# Patient Record
Sex: Male | Born: 1970 | Race: White | Hispanic: No | Marital: Single | Smoking: Current every day smoker
Health system: Southern US, Community
[De-identification: ages and names within clinical notes are randomized; demographics above are authoritative.]

## PROBLEM LIST (undated history)

## (undated) DIAGNOSIS — I10 Essential (primary) hypertension: Secondary | ICD-10-CM

## (undated) DIAGNOSIS — F419 Anxiety disorder, unspecified: Secondary | ICD-10-CM

## (undated) DIAGNOSIS — E119 Type 2 diabetes mellitus without complications: Secondary | ICD-10-CM

## (undated) DIAGNOSIS — J189 Pneumonia, unspecified organism: Secondary | ICD-10-CM

## (undated) DIAGNOSIS — F32A Depression, unspecified: Secondary | ICD-10-CM

## (undated) HISTORY — DX: Anxiety disorder, unspecified: F41.9

## (undated) HISTORY — PX: NO PAST SURGERIES: SHX2092

## (undated) HISTORY — DX: Depression, unspecified: F32.A

## (undated) HISTORY — DX: Pneumonia, unspecified organism: J18.9

## (undated) HISTORY — DX: Essential (primary) hypertension: I10

---

## 2019-10-15 ENCOUNTER — Ambulatory Visit: Payer: Self-pay | Admitting: Orthopaedic Surgery

## 2019-11-05 ENCOUNTER — Telehealth: Payer: Self-pay

## 2019-11-05 ENCOUNTER — Ambulatory Visit (INDEPENDENT_AMBULATORY_CARE_PROVIDER_SITE_OTHER): Payer: Worker's Compensation | Admitting: Orthopaedic Surgery

## 2019-11-05 ENCOUNTER — Encounter: Payer: Self-pay | Admitting: Orthopaedic Surgery

## 2019-11-05 ENCOUNTER — Ambulatory Visit: Payer: Self-pay

## 2019-11-05 VITALS — BP 147/92 | HR 83 | Ht 71.0 in | Wt 298.0 lb

## 2019-11-05 DIAGNOSIS — M5412 Radiculopathy, cervical region: Secondary | ICD-10-CM

## 2019-11-05 DIAGNOSIS — M542 Cervicalgia: Secondary | ICD-10-CM | POA: Diagnosis not present
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
## 2019-11-05 NOTE — Telephone Encounter (Signed)
E-mailed todays office note to the case mgr per her request
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
## 2019-11-08 ENCOUNTER — Encounter: Payer: Self-pay | Admitting: Orthopaedic Surgery

## 2019-11-08 ENCOUNTER — Ambulatory Visit (INDEPENDENT_AMBULATORY_CARE_PROVIDER_SITE_OTHER): Payer: Worker's Compensation | Admitting: Orthopaedic Surgery

## 2019-11-08 DIAGNOSIS — M4722 Other spondylosis with radiculopathy, cervical region: Secondary | ICD-10-CM | POA: Diagnosis not present

## 2019-11-08 MED ORDER — PREDNISONE 5 MG (21) PO TBPK: ORAL_TABLET | ORAL | 0 refills | Status: DC
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
## 2019-11-11 DIAGNOSIS — M4722 Other spondylosis with radiculopathy, cervical region: Secondary | ICD-10-CM | POA: Insufficient documentation

## 2019-11-14 ENCOUNTER — Telehealth: Payer: Self-pay | Admitting: Orthopaedic Surgery

## 2019-11-14 NOTE — Telephone Encounter (Signed)
Ok for this? Please advise.

## 2019-11-14 NOTE — Telephone Encounter (Signed)
Kelly with [employer] [employer] called stating the pt has been showing more symptoms and his leg gave out resulting in a serious fall; Tresa Endo believes it would be best for the pt to be written out of work until he can be seen on 11/27/19. Tresa Endo would like to have the work note faxed to her   Tresa Endo CB# [phone] Fax# [phone]

## 2019-11-15 ENCOUNTER — Telehealth: Payer: Self-pay | Admitting: Surgery

## 2019-11-15 NOTE — Telephone Encounter (Signed)
Faxed

## 2019-11-15 NOTE — Telephone Encounter (Signed)
Holding for JN/Christy

## 2019-11-15 NOTE — Telephone Encounter (Signed)
Patient called back with the fax number to send note to his job. [phone]
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
## 2019-11-18 NOTE — Telephone Encounter (Signed)
See other note. Duplicate request 

## 2019-11-18 NOTE — Telephone Encounter (Signed)
This looks like a yates patient.

## 2019-11-18 NOTE — Telephone Encounter (Signed)
Please advise. OK for note?  

## 2019-11-18 NOTE — Telephone Encounter (Signed)
Note generated and faxed.  °

## 2019-11-18 NOTE — Telephone Encounter (Signed)
OK for note. thanks
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
## 2019-11-27 ENCOUNTER — Other Ambulatory Visit: Payer: Self-pay

## 2019-11-27 ENCOUNTER — Telehealth: Payer: Self-pay

## 2019-11-27 ENCOUNTER — Ambulatory Visit (INDEPENDENT_AMBULATORY_CARE_PROVIDER_SITE_OTHER): Payer: Worker's Compensation | Admitting: Surgery

## 2019-11-27 ENCOUNTER — Encounter: Payer: Self-pay | Admitting: Surgery

## 2019-11-27 VITALS — BP 145/90 | HR 83 | Ht 71.0 in | Wt 298.0 lb

## 2019-11-27 DIAGNOSIS — M5412 Radiculopathy, cervical region: Secondary | ICD-10-CM | POA: Diagnosis not present

## 2019-11-27 DIAGNOSIS — R2681 Unsteadiness on feet: Secondary | ICD-10-CM

## 2019-11-27 DIAGNOSIS — R29898 Other symptoms and signs involving the musculoskeletal system: Secondary | ICD-10-CM | POA: Diagnosis not present

## 2019-11-27 DIAGNOSIS — M4802 Spinal stenosis, cervical region: Secondary | ICD-10-CM | POA: Diagnosis not present

## 2019-11-27 NOTE — Telephone Encounter (Signed)
Are you working on this for patient?

## 2019-11-27 NOTE — Telephone Encounter (Signed)
Jessica from [facility] called in wanting to know if patient should go to er for stat cervical mri . Says not sure if they can sch it for him.

## 2019-11-27 NOTE — Progress Notes (Signed)
Office Visit Note   Patient: Philip Stokes           Date of Birth: [birth year]           MRN: [id number] Visit Date: 11/27/2019              Requested by: No referring provider defined for this encounter. PCP: Patient, No Pcp Per   Assessment & Plan: Visit Diagnoses:  1. Radiculopathy, cervical region   2. Spinal stenosis of cervical region   3. Weakness of both lower extremities   4. Upper extremity weakness   5. Unsteady gait     Plan: With patient's progressively worsening weakness by history and exam along with unsteady gait with 2 recent falls I recommend getting a stat repeat cervical MRI to compare to the study that was done November 07, 2019.  Patient's history and exam is completely different from when I initially saw him.  Worker's Comp. RN case manager here today and will help to get this scan done today.  I did review this case with Dr. Ophelia Charter this afternoon and he agrees with getting this stat study as soon as possible.  Patient was recently seen by gastroenterologist for melena so he cannot take oral NSAIDs.  I was asked if he could increase his dose of gabapentin but I did not recommend this due to the fact that this could also worsen gait abnormalities and risk of falls.  Patient or RN case manager will need to bring [place] a copy of the disc today if scan is able to get approved through [employer].  Follow-Up Instructions: Return in about 1 week (around 12/04/2019) for With Dr. Ophelia Charter.   Orders:  No orders of the defined types were placed in this encounter.  No orders of the defined types were placed in this encounter.     Procedures: No procedures performed   Clinical Data: No additional findings.   Subjective: Chief Complaint  Patient presents with  . Neck - Pain    HPI 49 year old white male comes in today for recheck of his neck issues.  Patient's current neck issues result of a workers comp injury that is well-documented in his chart.  Patient had last seen  Dr. Ophelia Charter for review of the cervical MRI and that is also well documented.  Patient states that Dr. Ophelia Charter advised him that ultimately may come down to him needing surgical intervention for his neck.  Patient states that he was also advised to watch out for any symptoms of weakness, unsteady gait etc. due to the severity of his findings.  On November 13, 2019 patient had returned back to work and he was getting up onto his truck when his left leg gave out and he fell from a height of about 4 to 5 feet landing directly onto his back and neck.  Patient states that he also suffered another fall in his home last week where he got up from his chair and fell forward.  Since his last visit with me he has had worsening issues with unsteady gait and feeling "clumsy".  Did not describe the symptoms when I had previously seen him.  Denies any bowel or bladder incontinence.  Was recently seen in the emergency room for melena.  He is not able to take any oral NSAIDs.  Review of Systems No current cardiac pulmonary issues.  Patient recently seen in the emergency room for melena  Objective: Vital Signs: BP (!) 145/90   Pulse 83  Ht 5\' 11"  (1.803 m)   Wt 298 lb (135.2 kg)   BMI 41.56 kg/m   Physical Exam Pleasant white male alert and oriented in no acute distress.  Patient's gait is unsteady when I have him walk in the room.  He did appear to lose his balance a couple times when I had him turn around.  He does have trace bilateral biceps triceps quad hip flexion anterior tib and gastroc weakness on exam. Ortho Exam  Specialty Comments:  No specialty comments available.  Imaging: No results found.   PMFS History: Patient Active Problem List   Diagnosis Date Noted  . Other spondylosis with radiculopathy, cervical region 11/11/2019   History reviewed. No pertinent past medical history.  History reviewed. No pertinent family history.  History reviewed. No pertinent surgical history. Social History    Occupational History  . Not on file  Tobacco Use  . Smoking status: Current Every Day Smoker  . Smokeless tobacco: Never Used  Substance and Sexual Activity  . Alcohol use: Not Currently  . Drug use: Not on file  . Sexual activity: Not on file

## 2019-11-27 NOTE — Addendum Note (Signed)
Addended by: Rogers Seeds on: 11/27/2019 02:50 PM   Modules accepted: Orders

## 2019-11-28 ENCOUNTER — Ambulatory Visit
Admission: RE | Admit: 2019-11-28 | Discharge: 2019-11-28 | Disposition: A | Discharge status: Home / Self Care | Payer: Worker's Compensation | Source: Ambulatory Visit | Attending: Orthopaedic Surgery | Admitting: Orthopaedic Surgery

## 2019-11-28 DIAGNOSIS — R29898 Other symptoms and signs involving the musculoskeletal system: Secondary | ICD-10-CM

## 2019-11-28 DIAGNOSIS — M4802 Spinal stenosis, cervical region: Secondary | ICD-10-CM

## 2019-11-28 DIAGNOSIS — M5412 Radiculopathy, cervical region: Secondary | ICD-10-CM

## 2019-11-28 DIAGNOSIS — R2681 Unsteadiness on feet: Secondary | ICD-10-CM

## 2019-12-03 ENCOUNTER — Ambulatory Visit (INDEPENDENT_AMBULATORY_CARE_PROVIDER_SITE_OTHER): Payer: Worker's Compensation | Admitting: Orthopaedic Surgery

## 2019-12-03 DIAGNOSIS — M4722 Other spondylosis with radiculopathy, cervical region: Secondary | ICD-10-CM

## 2019-12-03 DIAGNOSIS — M4802 Spinal stenosis, cervical region: Secondary | ICD-10-CM | POA: Diagnosis not present

## 2019-12-03 MED ORDER — OXYCODONE-ACETAMINOPHEN 5-325 MG PO TABS: 1.0000 | ORAL_TABLET | Freq: Four times a day (QID) | ORAL | 0 refills | Status: DC | PRN
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
## 2019-12-04 ENCOUNTER — Other Ambulatory Visit: Payer: Self-pay

## 2019-12-04 NOTE — Progress Notes (Signed)
Your procedure is scheduled on Monday, September 13th.  Report to [facility] Main Entrance "A" at 10:30 A.M., and check in at the Admitting office.  Call this number if you have problems the morning of surgery:  [phone]  Call [phone] if you have any questions prior to your surgery date Monday-Friday 8am-4pm   Remember:  Do not eat after midnight the night before your surgery  You may drink clear liquids until 9:30 A.M. the morning of your surgery.   Clear liquids allowed are: Water, Non-Citrus Juices (without pulp), Carbonated Beverages, Clear Tea, Black Coffee Only, and Gatorade  Enhanced Recovery after Surgery for Orthopedics Enhanced Recovery after Surgery is a protocol used to improve the stress on your body and your recovery after surgery.  Patient Instructions  . The night before surgery:  o No food after midnight. ONLY clear liquids after midnight  .  Marland Kitchen The day of surgery (if you do NOT have diabetes):  o Drink ONE (1) Pre-Surgery Clear Ensure by 9:30 A.M. the morning of surgery o This drink was given to you during your hospital  pre-op appointment visit. o Nothing else to drink after completing the  Pre-Surgery Clear Ensure.         If you have questions, please contact your surgeon's office.   Take these medicines the morning of surgery with A SIP OF WATER  busPIRone (BUSPAR)  carvedilol (COREG) gabapentin (NEURONTIN) pantoprazole (PROTONIX)  predniSONE (STERAPRED UNI-PAK 21 TAB)  IF NEEDED: oxyCODONE-acetaminophen (PERCOCET/ROXICET)   As of today, STOP taking any Aspirin (unless otherwise instructed by your surgeon) Aleve, Naproxen, Ibuprofen, Motrin, Advil, Goody's, BC's, all herbal medications, fish oil, and all vitamins.                  Do not wear jewelry.            Do not wear lotions, powders, colognes, or deodorant.            Men may shave face and neck.            Do not bring valuables to the hospital.            [facility] is not  responsible for any belongings or valuables.  Do NOT Smoke (Tobacco/Vaping) or drink Alcohol 24 hours prior to your procedure If you use a CPAP at night, you may bring all equipment for your overnight stay.   Contacts, glasses, dentures or bridgework may not be worn into surgery.      For patients admitted to the hospital, discharge time will be determined by your treatment team.   Patients discharged the day of surgery will not be allowed to drive home, and someone needs to stay with them for 24 hours.  Special instructions:   [place]- Preparing For Surgery  Before surgery, you can play an important role. Because skin is not sterile, your skin needs to be as free of germs as possible. You can reduce the number of germs on your skin by washing with CHG (chlorahexidine gluconate) Soap before surgery.  CHG is an antiseptic cleaner which kills germs and bonds with the skin to continue killing germs even after washing.    Oral Hygiene is also important to reduce your risk of infection.  Remember - BRUSH YOUR TEETH THE MORNING OF SURGERY WITH YOUR REGULAR TOOTHPASTE  Please do not use if you have an allergy to CHG or antibacterial soaps. If your skin becomes reddened/irritated stop using the CHG.  Do not shave (including legs and underarms) for at least 48 hours prior to first CHG shower. It is OK to shave your face.  Please follow these instructions carefully.   1. Shower the NIGHT BEFORE SURGERY and the MORNING OF SURGERY with CHG Soap.   2. If you chose to wash your hair, wash your hair first as usual with your normal shampoo.  3. After you shampoo, rinse your hair and body thoroughly to remove the shampoo.  4. Use CHG as you would any other liquid soap. You can apply CHG directly to the skin and wash gently with a scrungie or a clean washcloth.   5. Apply the CHG Soap to your body ONLY FROM THE NECK DOWN.  Do not use on open wounds or open sores. Avoid contact with your eyes, ears,  mouth and genitals (private parts). Wash Face and genitals (private parts)  with your normal soap.   6. Wash thoroughly, paying special attention to the area where your surgery will be performed.  7. Thoroughly rinse your body with warm water from the neck down.  8. DO NOT shower/wash with your normal soap after using and rinsing off the CHG Soap.  9. Pat yourself dry with a CLEAN TOWEL.  10. Wear CLEAN PAJAMAS to bed the night before surgery  11. Place CLEAN SHEETS on your bed the night of your first shower and DO NOT SLEEP WITH PETS.  Day of Surgery: Wear Clean/Comfortable clothing the morning of surgery Do not apply any deodorants/lotions.   Remember to brush your teeth WITH YOUR REGULAR TOOTHPASTE.   Please read over the following fact sheets that you were given.

## 2019-12-05 ENCOUNTER — Inpatient Hospital Stay (HOSPITAL_COMMUNITY)
Admission: RE | Admit: 2019-12-05 | Discharge: 2019-12-05 | Disposition: A | Discharge status: Home / Self Care | Payer: Self-pay | Source: Ambulatory Visit

## 2019-12-06 ENCOUNTER — Other Ambulatory Visit: Payer: Self-pay

## 2019-12-06 ENCOUNTER — Encounter (HOSPITAL_COMMUNITY): Payer: Self-pay

## 2019-12-06 ENCOUNTER — Encounter (HOSPITAL_COMMUNITY)
Admission: RE | Admit: 2019-12-06 | Discharge: 2019-12-06 | Disposition: A | Discharge status: Home / Self Care | Payer: Worker's Compensation | Source: Ambulatory Visit | Attending: Surgery | Admitting: Surgery

## 2019-12-06 ENCOUNTER — Telehealth: Payer: Self-pay | Admitting: Orthopaedic Surgery

## 2019-12-06 ENCOUNTER — Other Ambulatory Visit: Payer: Self-pay | Admitting: Surgical

## 2019-12-06 ENCOUNTER — Encounter (HOSPITAL_COMMUNITY)
Admission: RE | Admit: 2019-12-06 | Discharge: 2019-12-06 | Disposition: A | Discharge status: Home / Self Care | Payer: Worker's Compensation | Source: Ambulatory Visit | Attending: Orthopaedic Surgery | Admitting: Orthopaedic Surgery

## 2019-12-06 DIAGNOSIS — Z01812 Encounter for preprocedural laboratory examination: Secondary | ICD-10-CM | POA: Insufficient documentation

## 2019-12-06 DIAGNOSIS — M50121 Cervical disc disorder at C4-C5 level with radiculopathy: Secondary | ICD-10-CM | POA: Diagnosis not present

## 2019-12-06 DIAGNOSIS — E119 Type 2 diabetes mellitus without complications: Secondary | ICD-10-CM | POA: Diagnosis not present

## 2019-12-06 DIAGNOSIS — I10 Essential (primary) hypertension: Secondary | ICD-10-CM | POA: Diagnosis not present

## 2019-12-06 DIAGNOSIS — M4802 Spinal stenosis, cervical region: Secondary | ICD-10-CM | POA: Diagnosis not present

## 2019-12-06 DIAGNOSIS — Z01818 Encounter for other preprocedural examination: Secondary | ICD-10-CM

## 2019-12-06 LAB — URINALYSIS, ROUTINE W REFLEX MICROSCOPIC
Bilirubin Urine: NEGATIVE
Glucose, UA: 50 mg/dL — AB
Ketones, ur: NEGATIVE mg/dL
Leukocytes,Ua: NEGATIVE
Nitrite: NEGATIVE
Protein, ur: NEGATIVE mg/dL
Specific Gravity, Urine: 1.01 (ref 1.005–1.030)
pH: 5 (ref 5.0–8.0)

## 2019-12-06 LAB — COMPREHENSIVE METABOLIC PANEL
ALT: 44 U/L (ref 0–44)
AST: 28 U/L (ref 15–41)
Albumin: 4.4 g/dL (ref 3.5–5.0)
Alkaline Phosphatase: 123 U/L (ref 38–126)
Anion gap: 11 (ref 5–15)
BUN: 10 mg/dL (ref 6–20)
CO2: 22 mmol/L (ref 22–32)
Calcium: 9.8 mg/dL (ref 8.9–10.3)
Chloride: 100 mmol/L (ref 98–111)
Creatinine, Ser: 0.91 mg/dL (ref 0.61–1.24)
GFR calc Af Amer: >60 mL/min (ref >60)
GFR calc non Af Amer: >60 mL/min (ref >60)
Glucose, Bld: 202 mg/dL — ABNORMAL HIGH (ref 70–99)
Potassium: 3.9 mmol/L (ref 3.5–5.1)
Sodium: 133 mmol/L — ABNORMAL LOW (ref 135–145)
Total Bilirubin: 0.3 mg/dL (ref 0.3–1.2)

## 2019-12-06 LAB — CBC
HCT: 50.2 % (ref 39.0–52.0)
Hemoglobin: 16.9 g/dL (ref 13.0–17.0)
MCH: 31 pg (ref 26.0–34.0)
MCHC: 33.7 g/dL (ref 30.0–36.0)
MCV: 92.1 fL (ref 80.0–100.0)
Platelets: 254 10*3/uL (ref 150–400)
RBC: 5.45 MIL/uL (ref 4.22–5.81)
RDW: 13.6 % (ref 11.5–15.5)
WBC: 10.7 10*3/uL — ABNORMAL HIGH (ref 4.0–10.5)
nRBC: 0 % (ref 0.0–0.2)

## 2019-12-06 LAB — TYPE AND SCREEN
ABO/RH(D): A POS
Antibody Screen: NEGATIVE

## 2019-12-06 LAB — SURGICAL PCR SCREEN
MRSA, PCR: NEGATIVE
Staphylococcus aureus: NEGATIVE

## 2019-12-06 LAB — HEMOGLOBIN A1C
Hgb A1c MFr Bld: 9.8 % — ABNORMAL HIGH (ref 4.8–5.6)
Mean Plasma Glucose: 234.56 mg/dL

## 2019-12-06 MED ORDER — DEXTROSE 5 % IV SOLN
3.0000 g | INTRAVENOUS | Status: AC
  Administered 2019-12-09: 3 g via INTRAVENOUS
  Filled 2019-12-06: qty 3

## 2019-12-06 MED ORDER — ALPRAZOLAM 1 MG PO TABS: 1.0000 mg | ORAL_TABLET | Freq: Every evening | ORAL | 0 refills | Status: AC | PRN

## 2019-12-06 NOTE — Telephone Encounter (Signed)
Can you advise since Ophelia Charter is out? His surgery is Monday

## 2019-12-06 NOTE — Telephone Encounter (Signed)
Submitted RX.  Please let him know dont take with Percocet

## 2019-12-06 NOTE — Progress Notes (Signed)
Anesthesia Chart Review:  New diagnosis of uncontrolled type 2 diabetes at preadmission testing appointment.  CBG was 202, A1c was ordered which resulted at 9.8.  Patient carries no diagnosis of diabetes and is on no medication for this.  Dr. Ophelia Charter was contacted with these results.  He advised that due to patient's severe cervical stenosis and risk for permanent loss of function, the surgery needs to proceed as planned.  He understands the patient has new diagnosis of uncontrolled diabetes.  I will consult diabetes coordinator to see patient during admission.  Blood pressure initially significantly elevated on arrival, patient admitted to severe anxiety regarding upcoming surgery.  First blood pressure 179/98 however improved to 135/97 on recheck.  He is treated for hypertension on carvedilol and lisinopril-HCTZ.  Remainder of preop labs were unremarkable.  EKG 08/13/2019 (see copy in chart): NSR.  Rate 68.  Rightward axis.  Low voltage QRS.  Cannot rule out anterior infarct (cited on or before 05/02/2017).  When compared to ECG of 05/02/2017, no significant changes found.  TTE 04/09/2012 (Care Everywhere): Interpretation Summary  The study was technically difficult.There is normal left ventricular wall  thickness.  No regional wall motion abnormalities noted.  The left ventricle is hyperdynamic.  The study was technically difficult.  Left Ventricle  The left ventricle is borderline dilated. There is no thrombus. There is  normal left ventricular wall thickness. Left ventricular systolic function  is normal. Ejection Fraction = >55%. The left ventricle is hyperdynamic. No  regional wall motion abnormalities noted.  Right Ventricle  Borderline right ventricular enlargement.  Atria  Borderline left atrial enlargement. Right atrial size is normal. The  interatrial septum is intact with no evidence for an atrial septal defect.  Mitral Valve  The mitral valve leaflets appear thickened, but open  well. There is trace  mitral regurgitation.  Tricuspid Valve  The tricuspid valve is normal in structure and function.  Aortic Valve  The aortic valve is normal in structure and function.  Pulmonic Valve  The pulmonic valve is not well visualized.  Great Vessels  The aortic root is normal size.  Pericardium/Pleural  There is no pericardial effusion.   Zannie Cove [facility] Short Stay Center/Anesthesiology Phone [phone] 12/06/2019 3:52 PM

## 2019-12-06 NOTE — Progress Notes (Signed)
   12/06/19 0831  OBSTRUCTIVE SLEEP APNEA  Have you ever been diagnosed with sleep apnea through a sleep study? No  Do you snore loudly (loud enough to be heard through closed doors)?  1  Do you often feel tired, fatigued, or sleepy during the daytime (such as falling asleep during driving or talking to someone)? 0  Has anyone observed you stop breathing during your sleep? 1  Do you have, or are you being treated for high blood pressure? 1  BMI more than 35 kg/m2? 1  Age > 50 (1-yes) 0  Neck circumference greater than:Male 16 inches or larger, Male 17inches or larger? 1  Male Gender (Yes=1) 1  Obstructive Sleep Apnea Score 6

## 2019-12-06 NOTE — Anesthesia Preprocedure Evaluation (Addendum)
Anesthesia Evaluation  Patient identified by MRN, date of birth, ID band Patient awake    Reviewed: Allergy & Precautions, NPO status , Patient's Chart, lab work & pertinent test results, reviewed documented beta blocker date and time   Airway Mallampati: III  TM Distance: >3 FB Neck ROM: Limited  Mouth opening: Limited Mouth Opening  Dental  (+) Poor Dentition, Dental Advisory Given   Pulmonary Current Smoker,    breath sounds clear to auscultation       Cardiovascular hypertension, Pt. on medications and Pt. on home beta blockers  Rhythm:Regular     Neuro/Psych PSYCHIATRIC DISORDERS Anxiety Depression    GI/Hepatic   Endo/Other  diabetes, Poorly Controlled, Type 2  Renal/GU      Musculoskeletal   Abdominal   Peds  Hematology   Anesthesia Other Findings   Reproductive/Obstetrics                           Anesthesia Physical Anesthesia Plan  ASA: III  Anesthesia Plan: General   Post-op Pain Management:    Induction: Intravenous  PONV Risk Score and Plan: 1 and Ondansetron  Airway Management Planned: Oral ETT and Video Laryngoscope Planned  Additional Equipment: Arterial line  Intra-op Plan:   Post-operative Plan: Extubation in OR  Informed Consent: I have reviewed the patients History and Physical, chart, labs and discussed the procedure including the risks, benefits and alternatives for the proposed anesthesia with the patient or authorized representative who has indicated his/her understanding and acceptance.     Dental advisory given  Plan Discussed with: CRNA, Anesthesiologist and Surgeon  Anesthesia Plan Comments: (PAT note by Antionette Poles, PA-C:  New diagnosis of uncontrolled type 2 diabetes at preadmission testing appointment.  CBG was 202, A1c was ordered which resulted at 9.8.  Patient carries no diagnosis of diabetes and is on no medication for this.  Dr. Ophelia Charter was  contacted with these results.  He advised that due to patient's severe cervical stenosis and risk for permanent loss of function, the surgery needs to proceed as planned.  He understands the patient has new diagnosis of uncontrolled diabetes.  I will consult diabetes coordinator to see patient during admission.  Blood pressure initially significantly elevated on arrival, patient admitted to severe anxiety regarding upcoming surgery.  First blood pressure 179/98 however improved to 135/97 on recheck.  He is treated for hypertension on carvedilol and lisinopril-HCTZ.  Remainder of preop labs were unremarkable.  EKG 08/13/2019 (see copy in chart): NSR.  Rate 68.  Rightward axis.  Low voltage QRS.  Cannot rule out anterior infarct (cited on or before 05/02/2017).  When compared to ECG of 05/02/2017, no significant changes found.  TTE 04/09/2012 (Care Everywhere): Interpretation Summary  The study was technically difficult.There is normal left ventricular wall  thickness.  No regional wall motion abnormalities noted.  The left ventricle is hyperdynamic.  The study was technically difficult.  Left Ventricle  The left ventricle is borderline dilated. There is no thrombus. There is  normal left ventricular wall thickness. Left ventricular systolic function  is normal. Ejection Fraction = >55%. The left ventricle is hyperdynamic. No  regional wall motion abnormalities noted.  Right Ventricle  Borderline right ventricular enlargement.  Atria  Borderline left atrial enlargement. Right atrial size is normal. The  interatrial septum is intact with no evidence for an atrial septal defect.  Mitral Valve  The mitral valve leaflets appear thickened, but open well. There is  trace  mitral regurgitation.  Tricuspid Valve  The tricuspid valve is normal in structure and function.  Aortic Valve  The aortic valve is normal in structure and function.  Pulmonic Valve  The pulmonic valve is not well visualized.   Great Vessels  The aortic root is normal size.  Pericardium/Pleural  There is no pericardial effusion.  )       Anesthesia Quick Evaluation

## 2019-12-06 NOTE — Telephone Encounter (Signed)
IC advised. Patient verbalized understanding 

## 2019-12-06 NOTE — Progress Notes (Signed)
Your procedure is scheduled on Monday, September 13th.  Report to [facility] Main Entrance "A" at 10:30 A.M., and check in at the Admitting office.  Call this number if you have problems the morning of surgery:  [phone]  Call [phone] if you have any questions prior to your surgery date Monday-Friday 8am-4pm   Remember:  Do not eat after midnight the night before your surgery  You may drink clear liquids until 9:30 A.M. the morning of your surgery.   Clear liquids allowed are: Water, Non-Citrus Juices (without pulp), Carbonated Beverages, Clear Tea, Black Coffee Only, and Gatorade  Enhanced Recovery after Surgery for Orthopedics Enhanced Recovery after Surgery is a protocol used to improve the stress on your body and your recovery after surgery.  Patient Instructions   The night before surgery:  o No food after midnight. ONLY clear liquids after midnight     The day of surgery (if you do NOT have diabetes):  o Drink ONE (1) Pre-Surgery Clear Ensure by 9:30 A.M. the morning of surgery o This drink was given to you during your hospital  pre-op appointment visit. o Nothing else to drink after completing the  Pre-Surgery Clear Ensure.         If you have questions, please contact your surgeons office.   Take these medicines the morning of surgery with A SIP OF WATER    IF NEEDED:  Alprazolam (Xanax) oxyCODONE-acetaminophen (PERCOCET/ROXICET)   As of today, STOP taking any Aspirin (unless otherwise instructed by your surgeon) Aleve, Naproxen, Ibuprofen, Motrin, Advil, Goody's, BC's, all herbal medications, fish oil, and all vitamins.                  Do not wear jewelry.            Do not wear lotions, powders, colognes, or deodorant.            Men may shave face and neck.            Do not bring valuables to the hospital.            [facility] is not responsible for any belongings or valuables.  Do NOT Smoke (Tobacco/Vaping) or drink Alcohol 24 hours prior to  your procedure   Contacts, glasses, dentures or bridgework may not be worn into surgery.      For patients admitted to the hospital, discharge time will be determined by your treatment team.   Patients discharged the day of surgery will not be allowed to drive home, and someone needs to stay with them for 24 hours.  Special instructions:   [place]- Preparing For Surgery  Before surgery, you can play an important role. Because skin is not sterile, your skin needs to be as free of germs as possible. You can reduce the number of germs on your skin by washing with CHG (chlorahexidine gluconate) Soap before surgery.  CHG is an antiseptic cleaner which kills germs and bonds with the skin to continue killing germs even after washing.    Oral Hygiene is also important to reduce your risk of infection.  Remember - BRUSH YOUR TEETH THE MORNING OF SURGERY WITH YOUR REGULAR TOOTHPASTE  Please do not use if you have an allergy to CHG or antibacterial soaps. If your skin becomes reddened/irritated stop using the CHG.  Do not shave (including legs and underarms) for at least 48 hours prior to first CHG shower. It is OK to shave your face.  Please follow  these instructions carefully.   1. Shower the NIGHT BEFORE SURGERY and the MORNING OF SURGERY with CHG Soap.   2. If you chose to wash your hair, wash your hair first as usual with your normal shampoo.  3. After you shampoo, rinse your hair and body thoroughly to remove the shampoo.  4. Use CHG as you would any other liquid soap. You can apply CHG directly to the skin and wash gently with a scrungie or a clean washcloth.   5. Apply the CHG Soap to your body ONLY FROM THE NECK DOWN.  Do not use on open wounds or open sores. Avoid contact with your eyes, ears, mouth and genitals (private parts). Wash Face and genitals (private parts)  with your normal soap.   6. Wash thoroughly, paying special attention to the area where your surgery will be  performed.  7. Thoroughly rinse your body with warm water from the neck down.  8. DO NOT shower/wash with your normal soap after using and rinsing off the CHG Soap.  9. Pat yourself dry with a CLEAN TOWEL.  10. Wear CLEAN PAJAMAS to bed the night before surgery  11. Place CLEAN SHEETS on your bed the night of your first shower and DO NOT SLEEP WITH PETS.  Day of Surgery: Shower with CHG soap as directed Wear Clean/Comfortable clothing the morning of surgery Do not apply any deodorants/lotions.   Remember to brush your teeth WITH YOUR REGULAR TOOTHPASTE.   Please read over the following fact sheets that you were given.

## 2019-12-06 NOTE — Telephone Encounter (Signed)
Patient stopped by,   He said he is having severe anxiety about his upcoming surgery and wanted to know if he could be prescribed xanax. He is also requesting an updated note for work that extends his time out.   Call back: [phone]

## 2019-12-06 NOTE — Progress Notes (Signed)
PCP - Bradly Chris, NP at [facility], [place], [place] Cardiologist - patient denies  PPM/ICD - n/a Device Orders -  Rep Notified -   Chest x-ray - 12/06/19 EKG - 08/13/2019 Stress Test - patient denies ECHO - 04/09/12 Cardiac Cath - patient denies  Sleep Study - patient denies, positive stop bang, sent to PCP CPAP -   Fasting Blood Sugar - n/a Checks Blood Sugar _____ times a day  Blood Thinner Instructions: n/a Aspirin Instructions: n/a  ERAS Protcol - clears until 0930 PRE-SURGERY Ensure or G2- Ensure provided  COVID TEST- tomorrow 12/07/19   Anesthesia review:   Patient denies shortness of breath, fever, cough and chest pain at PAT appointment   All instructions explained to the patient, with a verbal understanding of the material. Patient agrees to go over the instructions while at home for a better understanding. Patient also instructed to self quarantine after being tested for COVID-19. The opportunity to ask questions was provided.

## 2019-12-07 ENCOUNTER — Other Ambulatory Visit (HOSPITAL_COMMUNITY)
Admission: RE | Admit: 2019-12-07 | Discharge: 2019-12-07 | Disposition: A | Discharge status: Home / Self Care | Payer: Self-pay | Source: Ambulatory Visit | Attending: Orthopaedic Surgery | Admitting: Orthopaedic Surgery

## 2019-12-07 DIAGNOSIS — Z20822 Contact with and (suspected) exposure to covid-19: Secondary | ICD-10-CM | POA: Insufficient documentation

## 2019-12-07 DIAGNOSIS — Z01812 Encounter for preprocedural laboratory examination: Secondary | ICD-10-CM | POA: Insufficient documentation

## 2019-12-07 LAB — SARS CORONAVIRUS 2 (TAT 6-24 HRS): SARS Coronavirus 2: NEGATIVE

## 2019-12-08 NOTE — H&P (Signed)
Philip Stokes is an 49 y.o. male.   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  Seen by dr Philip Stokes today review cervical mri.  Patient with recent hx of melena but denies current issues.  Denies cp, sob, abd pain, melena, hematochezia.  No medical clearance to review.  Patient admits to smoking 3 PPD.    Past Medical History:  Diagnosis Date  . Anxiety   . Depression   . Hypertension   . Pneumonia     Past Surgical History:  Procedure Laterality Date  . NO PAST SURGERIES      No family history on file. Social History:  reports that he has been smoking. He has been smoking about 3.00 packs per day. He has never used smokeless tobacco. He reports previous alcohol use. He reports previous drug use.  Allergies: No Known Allergies  No medications prior to admission.    Results for orders placed or performed during the hospital encounter of 12/07/19 (from the past 48 hour(s))  SARS CORONAVIRUS 2 (TAT 6-24 HRS) Nasopharyngeal Nasopharyngeal Swab     Status: None   Collection Time: 12/07/19  1:55 PM   Specimen: Nasopharyngeal Swab  Result Value Ref Range   SARS Coronavirus 2 NEGATIVE NEGATIVE    Comment: (NOTE) SARS-CoV-2 target nucleic acids are NOT DETECTED.  The SARS-CoV-2 RNA is generally detectable in upper and lower respiratory specimens during the acute phase of infection. Negative results do not preclude SARS-CoV-2 infection, do not rule out co-infections with other pathogens, and should not be used as the sole basis for treatment or other patient management decisions. Negative results must be combined with clinical observations, patient history, and epidemiological information. The expected result is Negative.  Fact Sheet for Patients: HairSlick.no  Fact Sheet for Healthcare  Providers: quierodirigir.com  This test is not yet approved or cleared by the [place] FDA and  has been authorized for detection and/or diagnosis of SARS-CoV-2 by FDA under an Emergency Use Authorization (EUA). This EUA will remain  in effect (meaning this test can be used) for the duration of the COVID-19 declaration under Se ction 564(b)(1) of the Act, 21 U.S.C. section 360bbb-3(b)(1), unless the authorization is terminated or revoked sooner.  Performed at [facility], [address] [place], [place] [address]    No results found.  Review of Systems  There were no vitals taken for this visit. Physical Exam HENT:     Head: Normocephalic.  Eyes:     Extraocular Movements: Extraocular movements intact.     Pupils: Pupils are equal, round, and reactive to light.  Cardiovascular:     Rate and Rhythm: Regular rhythm.  Pulmonary:     Effort: Pulmonary effort is normal. No respiratory distress.     Breath sounds: Normal breath sounds.  Abdominal:     General: Bowel sounds are normal.     Tenderness: There is no abdominal tenderness.  Musculoskeletal:        General: Tenderness present.  Neurological:     Mental Status: He is alert.  Psychiatric:        Mood and Affect: Mood normal.      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  Surgical procedure discussed in detail. Advised patient the importance of not smoking and that this can  increase chances of getting a postop pseudoarthrosis.  Also stressed the importance of being compliant with wearing cervical collar postop.  Voices understanding and all questions answered.    Philip Kief, PA-C 12/08/2019, 12:08 PM

## 2019-12-09 ENCOUNTER — Encounter (HOSPITAL_COMMUNITY): Payer: Self-pay | Admitting: Orthopaedic Surgery

## 2019-12-09 ENCOUNTER — Ambulatory Visit (HOSPITAL_COMMUNITY): Payer: Worker's Compensation

## 2019-12-09 ENCOUNTER — Other Ambulatory Visit: Payer: Self-pay

## 2019-12-09 ENCOUNTER — Observation Stay (HOSPITAL_COMMUNITY)
Admission: RE | Admit: 2019-12-09 | Discharge: 2019-12-10 | Disposition: A | Discharge status: Home / Self Care | Payer: Worker's Compensation | Attending: Orthopaedic Surgery | Admitting: Orthopaedic Surgery

## 2019-12-09 ENCOUNTER — Encounter (HOSPITAL_COMMUNITY)
Admission: RE | Disposition: A | Discharge status: Home / Self Care | Payer: Self-pay | Source: Home / Self Care | Attending: Orthopaedic Surgery

## 2019-12-09 ENCOUNTER — Ambulatory Visit (HOSPITAL_COMMUNITY): Payer: Worker's Compensation | Admitting: Anesthesiology

## 2019-12-09 ENCOUNTER — Ambulatory Visit (HOSPITAL_COMMUNITY): Payer: Worker's Compensation | Admitting: Physician Assistant

## 2019-12-09 DIAGNOSIS — M50121 Cervical disc disorder at C4-C5 level with radiculopathy: Secondary | ICD-10-CM | POA: Diagnosis not present

## 2019-12-09 DIAGNOSIS — I10 Essential (primary) hypertension: Secondary | ICD-10-CM | POA: Insufficient documentation

## 2019-12-09 DIAGNOSIS — M4802 Spinal stenosis, cervical region: Secondary | ICD-10-CM | POA: Insufficient documentation

## 2019-12-09 DIAGNOSIS — E1159 Type 2 diabetes mellitus with other circulatory complications: Secondary | ICD-10-CM

## 2019-12-09 DIAGNOSIS — Z419 Encounter for procedure for purposes other than remedying health state, unspecified: Secondary | ICD-10-CM

## 2019-12-09 DIAGNOSIS — E1169 Type 2 diabetes mellitus with other specified complication: Secondary | ICD-10-CM

## 2019-12-09 DIAGNOSIS — E669 Obesity, unspecified: Secondary | ICD-10-CM

## 2019-12-09 HISTORY — DX: Type 2 diabetes mellitus without complications: E11.9

## 2019-12-09 HISTORY — PX: ANTERIOR CERVICAL DECOMP/DISCECTOMY FUSION: SHX1161

## 2019-12-09 LAB — GLUCOSE, CAPILLARY
Glucose-Capillary: 437 mg/dL — ABNORMAL HIGH (ref 70–99)
Glucose-Capillary: 298 mg/dL — ABNORMAL HIGH (ref 70–99)
Glucose-Capillary: 194 mg/dL — ABNORMAL HIGH (ref 70–99)
Glucose-Capillary: 128 mg/dL — ABNORMAL HIGH (ref 70–99)
Glucose-Capillary: 159 mg/dL — ABNORMAL HIGH (ref 70–99)
Glucose-Capillary: 173 mg/dL — ABNORMAL HIGH (ref 70–99)
Glucose-Capillary: 211 mg/dL — ABNORMAL HIGH (ref 70–99)

## 2019-12-09 LAB — BASIC METABOLIC PANEL
Anion gap: 11 (ref 5–15)
BUN: 11 mg/dL (ref 6–20)
CO2: 22 mmol/L (ref 22–32)
Calcium: 9.3 mg/dL (ref 8.9–10.3)
Chloride: 103 mmol/L (ref 98–111)
Creatinine, Ser: 0.93 mg/dL (ref 0.61–1.24)
GFR calc Af Amer: >60 mL/min (ref >60)
GFR calc non Af Amer: >60 mL/min (ref >60)
Glucose, Bld: 410 mg/dL — ABNORMAL HIGH (ref 70–99)
Potassium: 4.2 mmol/L (ref 3.5–5.1)
Sodium: 136 mmol/L (ref 135–145)

## 2019-12-09 LAB — LIPID PANEL
Cholesterol: 194 mg/dL (ref 0–200)
HDL: 37 mg/dL — ABNORMAL LOW (ref >40)
LDL Cholesterol: 127 mg/dL — ABNORMAL HIGH (ref 0–99)
Total CHOL/HDL Ratio: 5.2 RATIO
Triglycerides: 150 mg/dL — ABNORMAL HIGH (ref <150)
VLDL: 30 mg/dL (ref 0–40)

## 2019-12-09 LAB — ABO/RH: ABO/RH(D): A POS

## 2019-12-09 SURGERY — ANTERIOR CERVICAL DECOMPRESSION/DISCECTOMY FUSION 3 LEVELS
Anesthesia: General | Site: Spine Cervical

## 2019-12-09 MED ORDER — ONDANSETRON HCL 4 MG PO TABS: 4.0000 mg | ORAL_TABLET | Freq: Four times a day (QID) | ORAL | Status: DC | PRN

## 2019-12-09 MED ORDER — HYDROCHLOROTHIAZIDE 12.5 MG PO CAPS
12.5000 mg | ORAL_CAPSULE | Freq: Every day | ORAL | Status: DC
  Administered 2019-12-09 – 2019-12-10 (×2): 12.5 mg via ORAL
  Filled 2019-12-09 (×2): qty 1

## 2019-12-09 MED ORDER — 0.9 % SODIUM CHLORIDE (POUR BTL) OPTIME
TOPICAL | Status: DC | PRN
  Administered 2019-12-09: 1000 mL

## 2019-12-09 MED ORDER — HEMOSTATIC AGENTS (NO CHARGE) OPTIME
TOPICAL | Status: DC | PRN
  Administered 2019-12-09: 1 via TOPICAL

## 2019-12-09 MED ORDER — ACETAMINOPHEN 10 MG/ML IV SOLN: 1000.0000 mg | Freq: Once | INTRAVENOUS | Status: DC | PRN

## 2019-12-09 MED ORDER — CARVEDILOL 6.25 MG PO TABS
6.2500 mg | ORAL_TABLET | Freq: Two times a day (BID) | ORAL | Status: DC
  Administered 2019-12-09 – 2019-12-10 (×2): 6.25 mg via ORAL
  Filled 2019-12-09 (×2): qty 1

## 2019-12-09 MED ORDER — HYDRALAZINE HCL 25 MG PO TABS: 25.0000 mg | ORAL_TABLET | Freq: Four times a day (QID) | ORAL | Status: DC | PRN

## 2019-12-09 MED ORDER — STERILE WATER FOR IRRIGATION IR SOLN
Status: DC | PRN
  Administered 2019-12-09: 1000 mL

## 2019-12-09 MED ORDER — LISINOPRIL-HYDROCHLOROTHIAZIDE 20-12.5 MG PO TABS: 1.0000 | ORAL_TABLET | Freq: Every day | ORAL | Status: DC

## 2019-12-09 MED ORDER — ONDANSETRON HCL 4 MG/2ML IJ SOLN
INTRAMUSCULAR | Status: DC | PRN
  Administered 2019-12-09: 4 mg via INTRAVENOUS

## 2019-12-09 MED ORDER — BUPIVACAINE-EPINEPHRINE 0.5% -1:200000 IJ SOLN
INTRAMUSCULAR | Status: AC
  Filled 2019-12-09: qty 1

## 2019-12-09 MED ORDER — METFORMIN HCL 500 MG PO TABS
500.0000 mg | ORAL_TABLET | Freq: Two times a day (BID) | ORAL | Status: DC
  Administered 2019-12-10: 500 mg via ORAL
  Filled 2019-12-09: qty 1

## 2019-12-09 MED ORDER — PHENYLEPHRINE 40 MCG/ML (10ML) SYRINGE FOR IV PUSH (FOR BLOOD PRESSURE SUPPORT)
PREFILLED_SYRINGE | INTRAVENOUS | Status: AC
  Filled 2019-12-09: qty 10

## 2019-12-09 MED ORDER — GABAPENTIN 300 MG PO CAPS
300.0000 mg | ORAL_CAPSULE | Freq: Every day | ORAL | Status: DC
  Administered 2019-12-09: 300 mg via ORAL
  Filled 2019-12-09: qty 1

## 2019-12-09 MED ORDER — FENTANYL CITRATE (PF) 250 MCG/5ML IJ SOLN
INTRAMUSCULAR | Status: DC | PRN
  Administered 2019-12-09: 150 ug via INTRAVENOUS
  Administered 2019-12-09 (×2): 50 ug via INTRAVENOUS

## 2019-12-09 MED ORDER — PROPOFOL 10 MG/ML IV BOLUS
INTRAVENOUS | Status: AC
  Filled 2019-12-09: qty 20

## 2019-12-09 MED ORDER — MIDAZOLAM HCL 2 MG/2ML IJ SOLN
INTRAMUSCULAR | Status: DC | PRN
  Administered 2019-12-09: 2 mg via INTRAVENOUS

## 2019-12-09 MED ORDER — METHOCARBAMOL 1000 MG/10ML IJ SOLN
500.0000 mg | Freq: Four times a day (QID) | INTRAVENOUS | Status: DC | PRN
  Filled 2019-12-09: qty 5

## 2019-12-09 MED ORDER — ALBUTEROL SULFATE HFA 108 (90 BASE) MCG/ACT IN AERS
INHALATION_SPRAY | RESPIRATORY_TRACT | Status: DC | PRN
  Administered 2019-12-09 (×2): 5 via RESPIRATORY_TRACT

## 2019-12-09 MED ORDER — ROCURONIUM BROMIDE 10 MG/ML (PF) SYRINGE
PREFILLED_SYRINGE | INTRAVENOUS | Status: AC
  Filled 2019-12-09: qty 10

## 2019-12-09 MED ORDER — CHLORHEXIDINE GLUCONATE 0.12 % MT SOLN: 15.0000 mL | Freq: Once | OROMUCOSAL | Status: AC

## 2019-12-09 MED ORDER — LIDOCAINE HCL (CARDIAC) PF 100 MG/5ML IV SOSY
PREFILLED_SYRINGE | INTRAVENOUS | Status: DC | PRN
  Administered 2019-12-09: 80 mg via INTRATRACHEAL

## 2019-12-09 MED ORDER — POLYETHYLENE GLYCOL 3350 17 G PO PACK
17.0000 g | PACK | Freq: Every day | ORAL | Status: DC
  Administered 2019-12-09 – 2019-12-10 (×2): 17 g via ORAL
  Filled 2019-12-09 (×2): qty 1

## 2019-12-09 MED ORDER — SUGAMMADEX SODIUM 200 MG/2ML IV SOLN
INTRAVENOUS | Status: DC | PRN
  Administered 2019-12-09: 300 mg via INTRAVENOUS

## 2019-12-09 MED ORDER — PANTOPRAZOLE SODIUM 40 MG PO TBEC
40.0000 mg | DELAYED_RELEASE_TABLET | Freq: Every day | ORAL | Status: DC
  Administered 2019-12-09: 40 mg via ORAL
  Filled 2019-12-09: qty 1

## 2019-12-09 MED ORDER — DOCUSATE SODIUM 100 MG PO CAPS
100.0000 mg | ORAL_CAPSULE | Freq: Two times a day (BID) | ORAL | Status: DC
  Administered 2019-12-09 – 2019-12-10 (×2): 100 mg via ORAL
  Filled 2019-12-09 (×2): qty 1

## 2019-12-09 MED ORDER — THROMBIN 5000 UNITS EX SOLR
CUTANEOUS | Status: AC
  Filled 2019-12-09: qty 5000

## 2019-12-09 MED ORDER — HYDROMORPHONE HCL 1 MG/ML IJ SOLN
0.2500 mg | INTRAMUSCULAR | Status: DC | PRN
  Administered 2019-12-09: 0.5 mg via INTRAVENOUS

## 2019-12-09 MED ORDER — ACETAMINOPHEN 160 MG/5ML PO SOLN: 1000.0000 mg | Freq: Once | ORAL | Status: DC | PRN

## 2019-12-09 MED ORDER — FENTANYL CITRATE (PF) 250 MCG/5ML IJ SOLN
INTRAMUSCULAR | Status: AC
  Filled 2019-12-09: qty 5

## 2019-12-09 MED ORDER — HYDROMORPHONE HCL 1 MG/ML IJ SOLN
0.5000 mg | INTRAMUSCULAR | Status: DC | PRN
  Administered 2019-12-09 – 2019-12-10 (×2): 1 mg via INTRAVENOUS
  Filled 2019-12-09 (×2): qty 1

## 2019-12-09 MED ORDER — OXYCODONE HCL 5 MG PO TABS
5.0000 mg | ORAL_TABLET | ORAL | Status: DC | PRN
  Administered 2019-12-09 – 2019-12-10 (×4): 5 mg via ORAL
  Filled 2019-12-09 (×4): qty 1

## 2019-12-09 MED ORDER — SODIUM CHLORIDE 0.9% FLUSH: 3.0000 mL | INTRAVENOUS | Status: DC | PRN

## 2019-12-09 MED ORDER — LACTATED RINGERS IV SOLN: INTRAVENOUS | Status: DC

## 2019-12-09 MED ORDER — NICOTINE 21 MG/24HR TD PT24
21.0000 mg | MEDICATED_PATCH | Freq: Every day | TRANSDERMAL | Status: DC
  Administered 2019-12-09 – 2019-12-10 (×2): 21 mg via TRANSDERMAL
  Filled 2019-12-09 (×2): qty 1

## 2019-12-09 MED ORDER — MENTHOL 3 MG MT LOZG: 1.0000 | LOZENGE | OROMUCOSAL | Status: DC | PRN

## 2019-12-09 MED ORDER — ACETAMINOPHEN 650 MG RE SUPP: 650.0000 mg | RECTAL | Status: DC | PRN

## 2019-12-09 MED ORDER — ONDANSETRON HCL 4 MG/2ML IJ SOLN
INTRAMUSCULAR | Status: AC
  Filled 2019-12-09: qty 2

## 2019-12-09 MED ORDER — ROCURONIUM BROMIDE 100 MG/10ML IV SOLN
INTRAVENOUS | Status: DC | PRN
  Administered 2019-12-09: 20 mg via INTRAVENOUS
  Administered 2019-12-09 (×2): 10 mg via INTRAVENOUS
  Administered 2019-12-09: 30 mg via INTRAVENOUS
  Administered 2019-12-09: 70 mg via INTRAVENOUS
  Administered 2019-12-09: 20 mg via INTRAVENOUS

## 2019-12-09 MED ORDER — DEXMEDETOMIDINE (PRECEDEX) IN NS 20 MCG/5ML (4 MCG/ML) IV SYRINGE
PREFILLED_SYRINGE | INTRAVENOUS | Status: DC | PRN
  Administered 2019-12-09: 20 ug via INTRAVENOUS
  Administered 2019-12-09: 8 ug via INTRAVENOUS

## 2019-12-09 MED ORDER — FENTANYL CITRATE (PF) 100 MCG/2ML IJ SOLN
INTRAMUSCULAR | Status: AC
  Administered 2019-12-09: 50 ug via INTRAVENOUS
  Filled 2019-12-09: qty 2

## 2019-12-09 MED ORDER — FENTANYL CITRATE (PF) 100 MCG/2ML IJ SOLN
INTRAMUSCULAR | Status: AC
  Filled 2019-12-09: qty 2

## 2019-12-09 MED ORDER — MIDAZOLAM HCL 2 MG/2ML IJ SOLN
INTRAMUSCULAR | Status: AC
  Filled 2019-12-09: qty 2

## 2019-12-09 MED ORDER — OXYCODONE HCL 5 MG PO TABS: 5.0000 mg | ORAL_TABLET | Freq: Once | ORAL | Status: DC | PRN

## 2019-12-09 MED ORDER — DEXMEDETOMIDINE (PRECEDEX) IN NS 20 MCG/5ML (4 MCG/ML) IV SYRINGE
PREFILLED_SYRINGE | INTRAVENOUS | Status: AC
  Filled 2019-12-09: qty 10

## 2019-12-09 MED ORDER — PHENOL 1.4 % MT LIQD: 1.0000 | OROMUCOSAL | Status: DC | PRN

## 2019-12-09 MED ORDER — ACETAMINOPHEN 325 MG PO TABS: 650.0000 mg | ORAL_TABLET | ORAL | Status: DC | PRN

## 2019-12-09 MED ORDER — SUCCINYLCHOLINE CHLORIDE 20 MG/ML IJ SOLN
INTRAMUSCULAR | Status: DC | PRN
  Administered 2019-12-09: 160 mg via INTRAVENOUS

## 2019-12-09 MED ORDER — ONDANSETRON HCL 4 MG/2ML IJ SOLN: 4.0000 mg | Freq: Four times a day (QID) | INTRAMUSCULAR | Status: DC | PRN

## 2019-12-09 MED ORDER — SUCCINYLCHOLINE CHLORIDE 200 MG/10ML IV SOSY
PREFILLED_SYRINGE | INTRAVENOUS | Status: AC
  Filled 2019-12-09: qty 10

## 2019-12-09 MED ORDER — INSULIN ASPART 100 UNIT/ML ~~LOC~~ SOLN
0.0000 [IU] | Freq: Three times a day (TID) | SUBCUTANEOUS | Status: DC
  Filled 2019-12-09: qty 0.15

## 2019-12-09 MED ORDER — INSULIN GLARGINE 100 UNIT/ML ~~LOC~~ SOLN
10.0000 [IU] | Freq: Every day | SUBCUTANEOUS | Status: DC
  Administered 2019-12-10: 10 [IU] via SUBCUTANEOUS
  Filled 2019-12-09 (×2): qty 0.1

## 2019-12-09 MED ORDER — SODIUM CHLORIDE 0.9 % IV SOLN: 250.0000 mL | INTRAVENOUS | Status: DC

## 2019-12-09 MED ORDER — SODIUM CHLORIDE 0.9% FLUSH
3.0000 mL | Freq: Two times a day (BID) | INTRAVENOUS | Status: DC
  Administered 2019-12-09 – 2019-12-10 (×2): 3 mL via INTRAVENOUS

## 2019-12-09 MED ORDER — HYDROMORPHONE HCL 1 MG/ML IJ SOLN
INTRAMUSCULAR | Status: AC
  Administered 2019-12-09: 0.5 mg via INTRAVENOUS
  Filled 2019-12-09: qty 1

## 2019-12-09 MED ORDER — CHLORHEXIDINE GLUCONATE 0.12 % MT SOLN
OROMUCOSAL | Status: AC
  Administered 2019-12-09: 15 mL via OROMUCOSAL
  Filled 2019-12-09: qty 15

## 2019-12-09 MED ORDER — CITALOPRAM HYDROBROMIDE 40 MG PO TABS
40.0000 mg | ORAL_TABLET | Freq: Every day | ORAL | Status: DC
  Administered 2019-12-09 – 2019-12-10 (×2): 40 mg via ORAL
  Filled 2019-12-09 (×2): qty 1

## 2019-12-09 MED ORDER — ACETAMINOPHEN 500 MG PO TABS: 1000.0000 mg | ORAL_TABLET | Freq: Once | ORAL | Status: DC | PRN

## 2019-12-09 MED ORDER — METHOCARBAMOL 500 MG PO TABS
500.0000 mg | ORAL_TABLET | Freq: Four times a day (QID) | ORAL | Status: DC | PRN
  Administered 2019-12-09 – 2019-12-10 (×2): 500 mg via ORAL
  Filled 2019-12-09 (×2): qty 1

## 2019-12-09 MED ORDER — BUPIVACAINE-EPINEPHRINE (PF) 0.5% -1:200000 IJ SOLN
INTRAMUSCULAR | Status: DC | PRN
  Administered 2019-12-09: 7 mL via PERINEURAL

## 2019-12-09 MED ORDER — CEFAZOLIN SODIUM-DEXTROSE 1-4 GM/50ML-% IV SOLN
1.0000 g | Freq: Three times a day (TID) | INTRAVENOUS | Status: AC
  Administered 2019-12-09 – 2019-12-10 (×2): 1 g via INTRAVENOUS
  Filled 2019-12-09 (×3): qty 50

## 2019-12-09 MED ORDER — FENTANYL CITRATE (PF) 100 MCG/2ML IJ SOLN: 25.0000 ug | INTRAMUSCULAR | Status: DC | PRN

## 2019-12-09 MED ORDER — ACETAMINOPHEN 10 MG/ML IV SOLN
INTRAVENOUS | Status: AC
  Administered 2019-12-09: 1000 mg via INTRAVENOUS
  Filled 2019-12-09: qty 100

## 2019-12-09 MED ORDER — OXYCODONE HCL 5 MG/5ML PO SOLN: 5.0000 mg | Freq: Once | ORAL | Status: DC | PRN

## 2019-12-09 MED ORDER — SODIUM CHLORIDE 0.9 % IV SOLN: INTRAVENOUS | Status: DC

## 2019-12-09 MED ORDER — PHENYLEPHRINE HCL-NACL 10-0.9 MG/250ML-% IV SOLN
INTRAVENOUS | Status: DC | PRN
  Administered 2019-12-09: 10 ug/min via INTRAVENOUS

## 2019-12-09 MED ORDER — ORAL CARE MOUTH RINSE: 15.0000 mL | Freq: Once | OROMUCOSAL | Status: AC

## 2019-12-09 MED ORDER — LISINOPRIL 20 MG PO TABS
20.0000 mg | ORAL_TABLET | Freq: Every day | ORAL | Status: DC
  Administered 2019-12-09 – 2019-12-10 (×2): 20 mg via ORAL
  Filled 2019-12-09 (×2): qty 1

## 2019-12-09 MED ORDER — FENTANYL CITRATE (PF) 100 MCG/2ML IJ SOLN
25.0000 ug | INTRAMUSCULAR | Status: DC | PRN
  Administered 2019-12-09 (×2): 50 ug via INTRAVENOUS

## 2019-12-09 MED ORDER — LIDOCAINE 2% (20 MG/ML) 5 ML SYRINGE
INTRAMUSCULAR | Status: AC
  Filled 2019-12-09: qty 5

## 2019-12-09 MED ORDER — PROPOFOL 10 MG/ML IV BOLUS
INTRAVENOUS | Status: DC | PRN
  Administered 2019-12-09: 200 mg via INTRAVENOUS

## 2019-12-09 MED ORDER — ALPRAZOLAM 0.5 MG PO TABS
1.0000 mg | ORAL_TABLET | Freq: Every evening | ORAL | Status: DC | PRN
  Administered 2019-12-09: 1 mg via ORAL
  Filled 2019-12-09 (×2): qty 2

## 2019-12-09 MED ORDER — INSULIN REGULAR(HUMAN) IN NACL 100-0.9 UT/100ML-% IV SOLN
INTRAVENOUS | Status: DC | PRN
  Administered 2019-12-09: 19 [IU]/h via INTRAVENOUS

## 2019-12-09 SURGICAL SUPPLY — 60 items
BENZOIN TINCTURE PRP APPL 2/3 (GAUZE/BANDAGES/DRESSINGS) ×3 IMPLANT
BIT DRILL SPINE QC 14 (BIT) ×3 IMPLANT
BLADE CLIPPER SURG (BLADE) IMPLANT
BONE CC-ACS 11X14 X8 6D (Neuro Prosthesis/Implant) ×9 IMPLANT
BUR ROUND FLUTED 4 SOFT TCH (BURR) IMPLANT
BUR ROUND FLUTED 4MM SOFT TCH (BURR)
CHIPS BONE CANC-ACS 11X14X8 6D (Neuro Prosthesis/Implant) ×3 IMPLANT
CLOSURE STERI-STRIP 1/2X4 (GAUZE/BANDAGES/DRESSINGS) ×1
CLOSURE WOUND 1/2 X4 (GAUZE/BANDAGES/DRESSINGS) ×1
CLSR STERI-STRIP ANTIMIC 1/2X4 (GAUZE/BANDAGES/DRESSINGS) ×2 IMPLANT
COLLAR CERV LO CONTOUR FIRM DE (SOFTGOODS) ×3 IMPLANT
CORD BIPOLAR FORCEPS 12FT (ELECTRODE) IMPLANT
COVER SURGICAL LIGHT HANDLE (MISCELLANEOUS) ×3 IMPLANT
COVER WAND RF STERILE (DRAPES) ×3 IMPLANT
DRAPE C-ARM 42X72 X-RAY (DRAPES) ×3 IMPLANT
DRAPE HALF SHEET 40X57 (DRAPES) ×3 IMPLANT
DRAPE MICROSCOPE LEICA (MISCELLANEOUS) ×3 IMPLANT
DURAPREP 6ML APPLICATOR 50/CS (WOUND CARE) ×3 IMPLANT
ELECT COATED BLADE 2.86 ST (ELECTRODE) ×3 IMPLANT
ELECT REM PT RETURN 9FT ADLT (ELECTROSURGICAL) ×3
ELECTRODE REM PT RTRN 9FT ADLT (ELECTROSURGICAL) ×1 IMPLANT
EVACUATOR 1/8 PVC DRAIN (DRAIN) ×3 IMPLANT
GAUZE SPONGE 4X4 12PLY STRL (GAUZE/BANDAGES/DRESSINGS) ×3 IMPLANT
GAUZE SPONGE 4X4 12PLY STRL LF (GAUZE/BANDAGES/DRESSINGS) ×3 IMPLANT
GAUZE XEROFORM 1X8 LF (GAUZE/BANDAGES/DRESSINGS) ×3 IMPLANT
GLOVE BIOGEL PI IND STRL 8 (GLOVE) ×2 IMPLANT
GLOVE BIOGEL PI INDICATOR 8 (GLOVE) ×4
GLOVE ORTHO TXT STRL SZ7.5 (GLOVE) ×6 IMPLANT
GOWN STRL REUS W/ TWL LRG LVL3 (GOWN DISPOSABLE) ×1 IMPLANT
GOWN STRL REUS W/ TWL XL LVL3 (GOWN DISPOSABLE) ×1 IMPLANT
GOWN STRL REUS W/TWL 2XL LVL3 (GOWN DISPOSABLE) ×3 IMPLANT
GOWN STRL REUS W/TWL LRG LVL3 (GOWN DISPOSABLE) ×2
GOWN STRL REUS W/TWL XL LVL3 (GOWN DISPOSABLE) ×2
HALTER HD/CHIN CERV TRACTION D (MISCELLANEOUS) ×3 IMPLANT
HEMOSTAT SURGICEL 2X14 (HEMOSTASIS) IMPLANT
KIT BASIN OR (CUSTOM PROCEDURE TRAY) ×3 IMPLANT
KIT TURNOVER KIT B (KITS) ×3 IMPLANT
MANIFOLD NEPTUNE II (INSTRUMENTS) ×3 IMPLANT
NEEDLE 25GX 5/8IN NON SAFETY (NEEDLE) ×3 IMPLANT
NS IRRIG 1000ML POUR BTL (IV SOLUTION) ×3 IMPLANT
PACK ORTHO CERVICAL (CUSTOM PROCEDURE TRAY) ×3 IMPLANT
PAD ARMBOARD 7.5X6 YLW CONV (MISCELLANEOUS) ×6 IMPLANT
PATTIES SURGICAL .5 X.5 (GAUZE/BANDAGES/DRESSINGS) IMPLANT
PLATE ANT CERV XTEND 3 LV (Plate) ×3 IMPLANT
POSITIONER HEAD DONUT 9IN (MISCELLANEOUS) ×3 IMPLANT
SCREW FIXATION TEMPORARY (Screw) ×3 IMPLANT
SCREW XTD VAR 4.2 SELF TAP (Screw) ×24 IMPLANT
STRIP CLOSURE SKIN 1/2X4 (GAUZE/BANDAGES/DRESSINGS) ×2 IMPLANT
SURGIFLO W/THROMBIN 8M KIT (HEMOSTASIS) IMPLANT
SUT BONE WAX W31G (SUTURE) ×3 IMPLANT
SUT VIC AB 2-0 CT1 27 (SUTURE) ×2
SUT VIC AB 2-0 CT1 TAPERPNT 27 (SUTURE) ×1 IMPLANT
SUT VIC AB 3-0 X1 27 (SUTURE) ×3 IMPLANT
SUT VICRYL 4-0 PS2 18IN ABS (SUTURE) ×6 IMPLANT
SYR 30ML SLIP (SYRINGE) ×3 IMPLANT
SYR BULB EAR ULCER 3OZ GRN STR (SYRINGE) ×3 IMPLANT
TAPE CLOTH 4X10 WHT NS (GAUZE/BANDAGES/DRESSINGS) ×3 IMPLANT
TOWEL GREEN STERILE (TOWEL DISPOSABLE) ×3 IMPLANT
TOWEL GREEN STERILE FF (TOWEL DISPOSABLE) ×3 IMPLANT
WATER STERILE IRR 1000ML POUR (IV SOLUTION) ×3 IMPLANT

## 2019-12-09 NOTE — Discharge Instructions (Addendum)
Ok to shower 2 days postop with extra cervical collar on provided.    Cervical collar must be on at all times even when showering.  Do not bend or turn neck.    Do not apply any creams or ointments to incision.    Do not remove steri-strips.    Leave dressing on it is water proof.   No aggressive activity.  No lifting, pushing, pulling  No driving  CALL OFFICE IMMEDIATELY IF ANY QUESTIONS OR CONCERNS.    IF YOU HAVE ANY DIFFICULTY SWALLOWING OR BREATHING YOU SHOULD call promptly to office.    Fingerstick glucose (sugar) goals for home: Before meals: 80-130 mg/dl 2-Hours after meals: less than 180 mg/dl Hemoglobin I7P goal: 7% or less  Websites:  Www.diabetes.org (American Diabetes Association) Calorie King (app on smart phone)   ___________________________________________________________ Carbohydrate Counting For People With Diabetes  Foods with carbohydrates make your blood glucose level go up. Learning how to count carbohydrates can help you control your blood glucose levels. First, identify the foods you eat that contain carbohydrates. Then, using the Foods with Carbohydrates chart, determine about how much carbohydrates are in your meals and snacks. Make sure you are eating foods with fiber, protein, and healthy fat along with your carbohydrate foods. Foods with Carbohydrates The following table shows carbohydrate foods that have about 15 grams of carbohydrate each. Using measuring cups, spoons, or a food scale when you first begin learning about carbohydrate counting can help you learn about the portion sizes you typically eat. The following foods have 15 grams carbohydrate each:  Grains . 1 slice bread (1 ounce)  . 1 small tortilla (6-inch size)  .  large bagel (1 ounce)  . 1/3 cup pasta or rice (cooked)  .  hamburger or hot dog bun ( ounce)  .  cup cooked cereal  .  to  cup ready-to-eat cereal  . 2 taco shells (5-inch size) Fruit . 1 small fresh fruit ( to  1 cup)  .  medium banana  . 17 small grapes (3 ounces)  . 1 cup melon or berries  .  cup canned or frozen fruit  . 2 tablespoons dried fruit (blueberries, cherries, cranberries, raisins)  .  cup unsweetened fruit juice  Starchy Vegetables .  cup cooked beans, peas, corn, potatoes/sweet potatoes  .  large baked potato (3 ounces)  . 1 cup acorn or butternut squash  Snack Foods . 3 to 6 crackers  . 8 potato chips or 13 tortilla chips ( ounce to 1 ounce)  . 3 cups popped popcorn  Dairy . 3/4 cup (6 ounces) nonfat plain yogurt, or yogurt with sugar-free sweetener  . 1 cup milk  . 1 cup plain rice, soy, coconut or flavored almond milk Sweets and Desserts .  cup ice cream or frozen yogurt  . 1 tablespoon jam, jelly, pancake syrup, table sugar, or honey  . 2 tablespoons light pancake syrup  . 1 inch square of frosted cake or 2 inch square of unfrosted cake  . 2 small cookies (2/3 ounce each) or  large cookie  Sometimes you'll have to estimate carbohydrate amounts if you don't know the exact recipe. One cup of mixed foods like soups can have 1 to 2 carbohydrate servings, while some casseroles might have 2 or more servings of carbohydrate. Foods that have less than 20 calories in each serving can be counted as "free" foods. Count 1 cup raw vegetables, or  cup cooked non-starchy vegetables as "free" foods.  If you eat 3 or more servings at one meal, then count them as 1 carbohydrate serving.  Foods without Carbohydrates  Not all foods contain carbohydrates. Meat, some dairy, fats, non-starchy vegetables, and many beverages don't contain carbohydrate. So when you count carbohydrates, you can generally exclude chicken, pork, beef, fish, seafood, eggs, tofu, cheese, butter, sour cream, avocado, nuts, seeds, olives, mayonnaise, water, black coffee, unsweetened tea, and zero-calorie drinks. Vegetables with no or low carbohydrate include green beans, cauliflower, tomatoes, and onions. How much  carbohydrate should I eat at each meal?  Carbohydrate counting can help you plan your meals and manage your weight. Following are some starting points for carbohydrate intake at each meal. Work with your registered dietitian nutritionist to find the best range that works for your blood glucose and weight.   To Lose Weight To Maintain Weight  Women 2 - 3 carb servings 3 - 4 carb servings  Men 3 - 4 carb servings 4 - 5 carb servings  Checking your blood glucose after meals will help you know if you need to adjust the timing, type, or number of carbohydrate servings in your meal plan. Achieve and keep a healthy body weight by balancing your food intake and physical activity.  Tips How should I plan my meals?  Plan for half the food on your plate to include non-starchy vegetables, like salad greens, broccoli, or carrots. Try to eat 3 to 5 servings of non-starchy vegetables every day. Have a protein food at each meal. Protein foods include chicken, fish, meat, eggs, or beans (note that beans contain carbohydrate). These two food groups (non-starchy vegetables and proteins) are low in carbohydrate. If you fill up your plate with these foods, you will eat less carbohydrate but still fill up your stomach. Try to limit your carbohydrate portion to  of the plate.  What fats are healthiest to eat?  Diabetes increases risk for heart disease. To help protect your heart, eat more healthy fats, such as olive oil, nuts, and avocado. Eat less saturated fats like butter, cream, and high-fat meats, like bacon and sausage. Avoid trans fats, which are in all foods that list "partially hydrogenated oil" as an ingredient. What should I drink?  Choose drinks that are not sweetened with sugar. The healthiest choices are water, carbonated or seltzer waters, and tea and coffee without added sugars.  Sweet drinks will make your blood glucose go up very quickly. One serving of soda or energy drink is  cup. It is best to drink  these beverages only if your blood glucose is low.  Artificially sweetened, or diet drinks, typically do not increase your blood glucose if they have zero calories in them. Read labels of beverages, as some diet drinks do have carbohydrate and will raise your blood glucose. Label Reading Tips Read Nutrition Facts labels to find out how many grams of carbohydrate are in a food you want to eat. Don't forget: sometimes serving sizes on the label aren't the same as how much food you are going to eat, so you may need to calculate how much carbohydrate is in the food you are serving yourself.   Carbohydrate Counting for People with Diabetes Sample 1-Day Menu  Breakfast  cup yogurt, low fat, low sugar (1 carbohydrate serving)   cup cereal, ready-to-eat, unsweetened (1 carbohydrate serving)  1 cup strawberries (1 carbohydrate serving)   cup almonds ( carbohydrate serving)  Lunch 1, 5 ounce can chunk light tuna  2 ounces cheese, low fat  cheddar  6 whole wheat crackers (1 carbohydrate serving)  1 small apple (1 carbohydrate servings)   cup carrots ( carbohydrate serving)   cup snap peas  1 cup 1% milk (1 carbohydrate serving)   Evening Meal Stir fry made with: 3 ounces chicken  1 cup brown rice (3 carbohydrate servings)   cup broccoli ( carbohydrate serving)   cup green beans   cup onions  1 tablespoon olive oil  2 tablespoons teriyaki sauce ( carbohydrate serving)  Evening Snack 1 extra small banana (1 carbohydrate serving)  1 tablespoon peanut butter   Carbohydrate Counting for People with Diabetes Vegan Sample 1-Day Menu  Breakfast 1 cup cooked oatmeal (2 carbohydrate servings)   cup blueberries (1 carbohydrate serving)  2 tablespoons flaxseeds  1 cup soymilk fortified with calcium and vitamin D  1 cup coffee  Lunch 2 slices whole wheat bread (2 carbohydrate servings)   cup baked tofu   cup lettuce  2 slices tomato  2 slices avocado   cup baby carrots ( carbohydrate  serving)  1 orange (1 carbohydrate serving)  1 cup soymilk fortified with calcium and vitamin D   Evening Meal Burrito made with: 1 6-inch corn tortilla (1 carbohydrate serving)  1 cup refried vegetarian beans (2 carbohydrate servings)   cup chopped tomatoes   cup lettuce   cup salsa  1/3 cup brown rice (1 carbohydrate serving)  1 tablespoon olive oil for rice   cup zucchini   Evening Snack 6 small whole grain crackers (1 carbohydrate serving)  2 apricots ( carbohydrate serving)   cup unsalted peanuts ( carbohydrate serving)    Carbohydrate Counting for People with Diabetes Vegetarian (Lacto-Ovo) Sample 1-Day Menu  Breakfast 1 cup cooked oatmeal (2 carbohydrate servings)   cup blueberries (1 carbohydrate serving)  2 tablespoons flaxseeds  1 egg  1 cup 1% milk (1 carbohydrate serving)  1 cup coffee  Lunch 2 slices whole wheat bread (2 carbohydrate servings)  2 ounces low-fat cheese   cup lettuce  2 slices tomato  2 slices avocado   cup baby carrots ( carbohydrate serving)  1 orange (1 carbohydrate serving)  1 cup unsweetened tea  Evening Meal Burrito made with: 1 6-inch corn tortilla (1 carbohydrate serving)   cup refried vegetarian beans (1 carbohydrate serving)   cup tomatoes   cup lettuce   cup salsa  1/3 cup brown rice (1 carbohydrate serving)  1 tablespoon olive oil for rice   cup zucchini  1 cup 1% milk (1 carbohydrate serving)  Evening Snack 6 small whole grain crackers (1 carbohydrate serving)  2 apricots ( carbohydrate serving)   cup unsalted peanuts ( carbohydrate serving)    Copyright 2020  Academy of Nutrition and Dietetics. All rights reserved.  Using Nutrition Labels: Carbohydrate  . Serving Size  . Look at the serving size. All the information on the label is based on this portion. Jolyne Loa Per Container  . The number of servings contained in the package. . Guidelines for Carbohydrate  . Look at the total grams of carbohydrate in  the serving size.  . 1 carbohydrate choice = 15 grams of carbohydrate. Range of Carbohydrate Grams Per Choice  Carbohydrate Grams/Choice Carbohydrate Choices  6-10   11-20 1  21-25 1  26-35 2  36-40 2  41-50 3  51-55 3  56-65 4  66-70 4  71-80 5    Copyright 2020  Academy of Nutrition and Dietetics. All rights reserved.

## 2019-12-09 NOTE — Anesthesia Procedure Notes (Signed)
Arterial Line Insertion Start/End9/13/2021 12:25 PM, 12/09/2019 12:35 PM Performed by: Tillman Abide, CRNA, CRNA  Patient location: Pre-op. Preanesthetic checklist: patient identified, IV checked, site marked, risks and benefits discussed, surgical consent, monitors and equipment checked, pre-op evaluation, timeout performed and anesthesia consent Left, ulnar was placed Catheter size: 20 G Hand hygiene performed , maximum sterile barriers used  and Seldinger technique used Allen's test indicative of satisfactory collateral circulation Attempts: 1 Procedure performed without using ultrasound guided technique. Following insertion, Biopatch. Post procedure assessment: normal  Patient tolerated the procedure well with no immediate complications.

## 2019-12-09 NOTE — Transfer of Care (Signed)
Immediate Anesthesia Transfer of Care Note  Patient: Philip Stokes  Procedure(s) Performed: Cervical Four to Five, Cervical Five to Six and Cervical Six to Seven ANTERIOR CERVICAL DECOMPRESSION/DISCECTOMY FUSION, ALLOGRAFT, PLATE (Spine Cervical)  Patient Location: PACU  Anesthesia Type:General  Level of Consciousness: awake, oriented and patient cooperative  Airway & Oxygen Therapy: Patient Spontanous Breathing and Patient connected to face mask oxygen  Post-op Assessment: Report given to RN and Post -op Vital signs reviewed and stable  Post vital signs: Reviewed  Last Vitals:  Vitals Value Taken Time  BP    Temp    Pulse    Resp    SpO2      Last Pain:  Vitals:   12/09/19 1126  TempSrc:   PainSc: 5       Patients Stated Pain Goal: 3 (12/09/19 1126)  Complications: No complications documented.

## 2019-12-09 NOTE — Anesthesia Procedure Notes (Signed)
Procedure Name: Intubation Date/Time: 12/09/2019 1:11 PM Performed by: Lovie Chol, CRNA Pre-anesthesia Checklist: Patient identified, Emergency Drugs available, Suction available, Patient being monitored and Timeout performed Patient Re-evaluated:Patient Re-evaluated prior to induction Oxygen Delivery Method: Circle system utilized Preoxygenation: Pre-oxygenation with 100% oxygen Induction Type: IV induction Ventilation: Oral airway inserted - appropriate to patient size, Mask ventilation with difficulty and Two handed mask ventilation required Laryngoscope Size: Glidescope and 4 Grade View: Grade I Tube type: Oral Tube size: 8.0 mm Number of attempts: 1 Airway Equipment and Method: Stylet and Video-laryngoscopy Placement Confirmation: ETT inserted through vocal cords under direct vision,  breath sounds checked- equal and bilateral and CO2 detector Secured at: 24 cm Tube secured with: Tape Dental Injury: Teeth and Oropharynx as per pre-operative assessment

## 2019-12-09 NOTE — Progress Notes (Addendum)
CRITICAL VALUE ALERT  Critical Value:  CBG 437  Date & Time Notied:  12/09/19 @ 1108  Provider Notified: Dr. Maple Hudson notified  Orders Received/Actions taken: received order to obtain bmet   Addendum to add that Dr. Ophelia Charter notified that patient arrived to short stay the day of surgery with CBG 437.  Dr. Ophelia Charter confirmed that we are moving forward with surgery.

## 2019-12-09 NOTE — Progress Notes (Signed)
Appreciate all help with his care of new Dx of type 2 DM.  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 Need to have his DM controlled to help prevent post op infection etc. May need to be on possibly Lantus for a few weeks until oral meds can be adjusted to get him controlled. He is agreeable to loose weight and follow diabetic diet.  Will leave all insulin Tx to hospitalist. Currently has insulin drip.

## 2019-12-09 NOTE — Consult Note (Signed)
[facility] Medical Consultation  Philip Stokes HLK:[id number] DOB: 1970-07-31 DOA: 12/09/2019 PCP: Patient, No Pcp Per   Requesting physician: Dr. Ophelia Charter Date of consultation: 12/09/2019 Reason for consultation: New onset diabetes  Impression/Recommendations Active Problems:   Cervical spinal stenosis    New onset diabetes.  -Patient claimed that his past A1c has been good. However preop A1c 9.8,Outpatient records show patient had 3 glucose reading outpatient this year, 133> 234> 166. Patient reported he lost about 50 pounds in past 34-month after diet and exercise. He further claimed that his cholesterol has been elevated in the past. -Suspect patient has metabolic syndrome, will start patient on Metformin. Patient desires pills or insulin. Plans to discharge with Metformin and glimepiride. As of now given the post surgery condition will do sliding scale, add Lantus 10 units. -Suspect patient may have post prandial hyperglycemia given to reading although 3 sugars are only moderate elevated. Recommend he go see endocrinologist patient rather talk to PCP first. -Check lipid panel -Recommend he go see ophthalmologist  Question of diabetic neuropathy -Symptoms can be attributed to cervical stenosis as well -Recommend she go see neurologist as outpatient for formal work-up  Hypertension -BP borderline low, on IV fluids, start as needed hydralazine -Resume home BP meds tomorrow.  Cervical stenosis status post C4-C5-C7 fusion and allograft. -Pain control, neck collar, drain in place  Morbid obesity -Continue diet and exercise  Cigarette smoke -Nicotine patch    I will followup again tomorrow. Please contact me if I can be of assistance in the meanwhile. Thank you for this consultation. Hold   Chief Complaint: Feeling ok  HPI:  Patient 49, history of glucose intolerance, borderline hyperlipidemia, hypertension on medication, cigarette smoke, morbid obesity, cervical  radiculopathy and stenosis presented with elective surgery of cervical spine. 3 days ago, during a preop blood work, patient's HbA1c 9.8. Patient reported he has been following with his PCP for " glucose problem", but he was never diagnosed with diabetes before. Patient had 2 recent falls and unsteady gait 3 months ago and went to see orthopedic surgery and was diagnosed with cervical radiculopathy and cervical stenosis. He was never diagnosed with diabetes but reported pins-and-needles feelings of bilateral feet and toes. He has been taking gabapentin with no significant improvement. He came in today for elective cervical surgery. His sugar was significantly elevated to 400 this morning, he attributed that to his taking two muffins last night. Patient denies any chest pain no short of breath, no trouble controlling urine or bowel movement.  Review of Systems:  14 point review system negative except those mentioned in HPI  Past Medical History:  Diagnosis Date  . Anxiety   . Depression   . Diabetes mellitus without complication (HCC)   . Hypertension   . Pneumonia    Past Surgical History:  Procedure Laterality Date  . NO PAST SURGERIES     Social History:  reports that he has been smoking. He has been smoking about 3.00 packs per day. He has never used smokeless tobacco. He reports previous alcohol use. He reports previous drug use.  No Known Allergies History reviewed. No pertinent family history.  Prior to Admission medications   Medication Sig Start Date End Date Taking? Authorizing Provider  ALPRAZolam Prudy Feeler) 1 MG tablet Take 1 tablet (1 mg total) by mouth at bedtime as needed for anxiety. 12/06/19  Yes Magnant, Charles L, PA-C  carvedilol (COREG) 6.25 MG tablet Take 6.25 mg by mouth 2 (two) times daily.  07/08/19  Yes [provider]  citalopram (CELEXA) 40 MG tablet Take 40 mg by mouth daily. 11/07/19  Yes [provider]  gabapentin (NEURONTIN) 300 MG capsule Take  300 mg by mouth at bedtime.  10/28/19  Yes [provider]  lisinopril-hydrochlorothiazide (ZESTORETIC) 20-12.5 MG tablet Take 1 tablet by mouth daily. 07/08/19  Yes [provider]  oxyCODONE-acetaminophen (PERCOCET/ROXICET) 5-325 MG tablet Take 1 tablet by mouth every 6 (six) hours as needed for severe pain. Workers comp 12/03/19  Yes Eldred Manges, MD  pantoprazole (PROTONIX) 40 MG tablet Take 40 mg by mouth at bedtime.  10/28/19  Yes [provider]  zolpidem (AMBIEN) 10 MG tablet Take 10 mg by mouth at bedtime as needed for sleep.  10/28/19 10/27/20 Yes [provider]   Physical Exam: Blood pressure (!) 145/83, pulse 84, temperature (!) 97.2 F (36.2 C), resp. rate 15, height 5\' 11"  (1.803 m), weight 133.8 kg, SpO2 93 %. Vitals:   12/09/19 1726 12/09/19 1741  BP: (!) 154/85 (!) 145/83  Pulse: 88 84  Resp: 16 15  Temp:    SpO2: 96% 93%     General: No acute distress, PERRL  Eyes: Reactive to accommodation  ENT: Moist, no pallor  Neck: In a collar and surgical drainage of bloody discharge  Cardiovascular: RRR, no murmurs  Respiratory: Clear breathing bilaterally no crackles no wheezing  Abdomen: Soft nontender nondistended  Skin: No rash  Musculoskeletal: Normal muscle tone  Psychiatric: Calm  Neurologic: No focal deficit  Labs on Admission:  Basic Metabolic Panel: Recent Labs  Lab 12/06/19 0913 12/09/19 1121  NA 133* 136  K 3.9 4.2  CL 100 103  CO2 22 22  GLUCOSE 202* 410*  BUN 10 11  CREATININE 0.91 0.93  CALCIUM 9.8 9.3   Liver Function Tests: Recent Labs  Lab 12/06/19 0913  AST 28  ALT 44  ALKPHOS 123  BILITOT 0.3  PROT RESULTS UNAVAILABLE DUE TO INTERFERING SUBSTANCE  ALBUMIN 4.4   No results for input(s): LIPASE, AMYLASE in the last 168 hours. No results for input(s): AMMONIA in the last 168 hours. CBC: Recent Labs  Lab 12/06/19 0913  WBC 10.7*  HGB 16.9  HCT 50.2  MCV 92.1  PLT 254   Cardiac  Enzymes: No results for input(s): CKTOTAL, CKMB, CKMBINDEX, TROPONINI in the last 168 hours. BNP: Invalid input(s): POCBNP CBG: Recent Labs  Lab 12/09/19 1336 12/09/19 1438 12/09/19 1535 12/09/19 1655 12/09/19 1810  GLUCAP 298* 194* 128* 159* 173*    Radiological Exams on Admission: No results found.  EKG: Independently reviewed. NSR, no acute ST changes  Time spent: 38 minutes  12/11/19 [facility] Pager [phone] 12/09/2019, 6:25 PM

## 2019-12-09 NOTE — Interval H&P Note (Signed)
History and Physical Interval Note:  12/09/2019 12:19 PM  Philip Stokes  has presented today for surgery, with the diagnosis of CERVICAL STENOSIS.  The various methods of treatment have been discussed with the patient and family. After consideration of risks, benefits and other options for treatment, the patient has consented to  Procedure(s): c4-5, c5-6, c6-7 ANTERIOR CERVICAL DECOMPRESSION/DISCECTOMY FUSION, ALLOGRAFT, PLATE (N/A) as a surgical intervention.  The patient's history has been reviewed, patient examined, no change in status, stable for surgery.  I have reviewed the patient's chart and labs.  Questions were answered to the patient's satisfaction.     Eldred Manges

## 2019-12-09 NOTE — Progress Notes (Signed)
Orthopedic Tech Progress Note Patient Details:  Philip Stokes 07-09-1970 [id number] Called in STAT order to [facility] for an ASPEN CERVICAL COLLAR. ORTHO TECHS DO NOT APPLY CERVICAL COLLARS Patient ID: Philip Stokes, male   DOB: 1970-10-20, 49 y.o.   MRN: [id number]   Philip Stokes 12/09/2019, 5:29 PM
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
## 2019-12-10 ENCOUNTER — Encounter (HOSPITAL_COMMUNITY): Payer: Self-pay | Admitting: Orthopaedic Surgery

## 2019-12-10 DIAGNOSIS — I10 Essential (primary) hypertension: Secondary | ICD-10-CM | POA: Diagnosis not present

## 2019-12-10 DIAGNOSIS — M4802 Spinal stenosis, cervical region: Secondary | ICD-10-CM | POA: Diagnosis present

## 2019-12-10 DIAGNOSIS — E1159 Type 2 diabetes mellitus with other circulatory complications: Secondary | ICD-10-CM | POA: Diagnosis not present

## 2019-12-10 DIAGNOSIS — E1169 Type 2 diabetes mellitus with other specified complication: Secondary | ICD-10-CM | POA: Diagnosis not present

## 2019-12-10 DIAGNOSIS — E669 Obesity, unspecified: Secondary | ICD-10-CM | POA: Diagnosis not present

## 2019-12-10 LAB — POCT I-STAT 7, (LYTES, BLD GAS, ICA,H+H)
Acid-base deficit: 2 mmol/L (ref 0.0–2.0)
Bicarbonate: 24.8 mmol/L (ref 20.0–28.0)
Calcium, Ion: 1.27 mmol/L (ref 1.15–1.40)
HCT: 41 % (ref 39.0–52.0)
Hemoglobin: 13.9 g/dL (ref 13.0–17.0)
O2 Saturation: 94 %
Patient temperature: 36.4
Potassium: 4.3 mmol/L (ref 3.5–5.1)
Sodium: 140 mmol/L (ref 135–145)
TCO2: 26 mmol/L (ref 22–32)
pCO2 arterial: 47.8 mmHg (ref 32.0–48.0)
pH, Arterial: 7.32 — ABNORMAL LOW (ref 7.350–7.450)
pO2, Arterial: 75 mmHg — ABNORMAL LOW (ref 83.0–108.0)

## 2019-12-10 LAB — GLUCOSE, CAPILLARY
Glucose-Capillary: 149 mg/dL — ABNORMAL HIGH (ref 70–99)
Glucose-Capillary: 189 mg/dL — ABNORMAL HIGH (ref 70–99)

## 2019-12-10 LAB — BASIC METABOLIC PANEL
Anion gap: 11 (ref 5–15)
BUN: 8 mg/dL (ref 6–20)
CO2: 24 mmol/L (ref 22–32)
Calcium: 8.6 mg/dL — ABNORMAL LOW (ref 8.9–10.3)
Chloride: 102 mmol/L (ref 98–111)
Creatinine, Ser: 0.83 mg/dL (ref 0.61–1.24)
GFR calc Af Amer: >60 mL/min (ref >60)
GFR calc non Af Amer: >60 mL/min (ref >60)
Glucose, Bld: 148 mg/dL — ABNORMAL HIGH (ref 70–99)
Potassium: 4 mmol/L (ref 3.5–5.1)
Sodium: 137 mmol/L (ref 135–145)

## 2019-12-10 LAB — CBC
HCT: 41.6 % (ref 39.0–52.0)
Hemoglobin: 13.4 g/dL (ref 13.0–17.0)
MCH: 29.7 pg (ref 26.0–34.0)
MCHC: 32.2 g/dL (ref 30.0–36.0)
MCV: 92.2 fL (ref 80.0–100.0)
Platelets: 178 10*3/uL (ref 150–400)
RBC: 4.51 MIL/uL (ref 4.22–5.81)
RDW: 13.7 % (ref 11.5–15.5)
WBC: 11.4 10*3/uL — ABNORMAL HIGH (ref 4.0–10.5)
nRBC: 0 % (ref 0.0–0.2)

## 2019-12-10 MED ORDER — OXYCODONE-ACETAMINOPHEN 5-325 MG PO TABS: 1.0000 | ORAL_TABLET | ORAL | 0 refills | Status: DC | PRN

## 2019-12-10 MED ORDER — METFORMIN HCL 500 MG PO TABS: 500.0000 mg | ORAL_TABLET | Freq: Two times a day (BID) | ORAL | 1 refills | Status: AC

## 2019-12-10 MED ORDER — GLIMEPIRIDE 1 MG PO TABS: 1.0000 mg | ORAL_TABLET | Freq: Every day | ORAL | Status: DC

## 2019-12-10 MED ORDER — METHOCARBAMOL 500 MG PO TABS: 500.0000 mg | ORAL_TABLET | Freq: Four times a day (QID) | ORAL | 0 refills | Status: DC | PRN

## 2019-12-10 MED ORDER — GLIMEPIRIDE 1 MG PO TABS: 1.0000 mg | ORAL_TABLET | Freq: Every day | ORAL | 0 refills | Status: AC

## 2019-12-10 MED ORDER — LIVING WELL WITH DIABETES BOOK
Freq: Once | Status: AC
  Filled 2019-12-10: qty 1

## 2019-12-10 MED FILL — Thrombin For Soln 5000 Unit: CUTANEOUS | Qty: 5000 | Status: AC

## 2019-12-10 NOTE — Evaluation (Signed)
Occupational Therapy Evaluation Patient Details Name: Philip Stokes MRN: [id number] DOB: 1970/04/04 Today's Date: 12/10/2019    History of Present Illness 49 yo male s/p C4-5 C5-6 C6-7 ACDF allograft plate PMH anxiety depression DM DM neuropathy morbid obesity smoker HTN    Clinical Impression   Patient is s/p ACDF with cord compression surgery resulting in functional limitations due to the deficits listed below (see OT problem list). Pt currently supervision for transfers and requires (A) for Bil LE adls. Pt able to cross L but not R in figure 4.  Patient will benefit from skilled OT acutely to increase independence and safety with ADLS to allow discharge home.     Follow Up Recommendations  No OT follow up    Equipment Recommendations  None recommended by OT    Recommendations for Other Services       Precautions / Restrictions Precautions Precautions: Cervical Precaution Comments: handout provided and reviewed in detail  Required Braces or Orthoses: Cervical Brace Cervical Brace: Soft collar;At all times;Other (comment) (contacted DR yates about poor fit of the Aspen collar)      Mobility Bed Mobility               General bed mobility comments: oob in chair on arrival plans to sleep in recliner  Transfers Overall transfer level: Needs assistance   Transfers: Sit to/from Stand Sit to Stand: Supervision              Balance Overall balance assessment: Mild deficits observed, not formally tested                                         ADL either performed or assessed with clinical judgement   ADL Overall ADL's : Needs assistance/impaired Eating/Feeding: Set up Eating/Feeding Details (indicate cue type and reason): reports difficulty swallow even liquids. Dr Ophelia Charter informed and aware. Pt reports feeling like its stuck and then passing with water Grooming: Set up   Upper Body Bathing: Minimal assistance   Lower Body Bathing: Moderate  assistance   Upper Body Dressing : Minimal assistance Upper Body Dressing Details (indicate cue type and reason): educated on don doff oc brace Lower Body Dressing: Moderate assistance Lower Body Dressing Details (indicate cue type and reason): daughter helping with ted hose Toilet Transfer: Radiographer, therapeutic Details (indicate cue type and reason): requires grab bar         Functional mobility during ADLs: Supervision/safety General ADL Comments: Dr Ophelia Charter approved soft collar at all times due to poor fit and lateral L side pressure. pt expressing feeling anxious in hard collar due to pain. Dr Ophelia Charter request this OT educate pt on using aspen for showering and OT did so. Pt and daughter present for all education.   Cervical precautions: Educated patient on don doff brace with return demonstration, educated on oral care using cups, washing face with cloth, never to wash directly on incision site, avoid neck rotation flexion and extension, positioning with pillows in chair for bil UE, sleeping positioning, avoiding pushing / pulling with bil UE, . Pt educated on need to notify doctor / RN of swallowing changes or choking..     Vision Baseline Vision/History: No visual deficits       Perception     Praxis      Pertinent Vitals/Pain Pain Assessment: Faces Faces Pain Scale: Hurts whole lot Pain Location: left lateral side  of neck and difficulty swallowing Pain Descriptors / Indicators: Guarding;Grimacing Pain Intervention(s): Monitored during session;Premedicated before session;Repositioned     Hand Dominance Right   Extremity/Trunk Assessment Upper Extremity Assessment Upper Extremity Assessment: Overall WFL for tasks assessed (denies any numbness or weakness)   Lower Extremity Assessment Lower Extremity Assessment: Overall WFL for tasks assessed   Cervical / Trunk Assessment Cervical / Trunk Assessment: Other exceptions Cervical / Trunk Exceptions: s/p surg    Communication Communication Communication: No difficulties   Cognition Arousal/Alertness: Awake/alert Behavior During Therapy: WFL for tasks assessed/performed;Anxious Overall Cognitive Status: Within Functional Limits for tasks assessed                                     General Comments  dressing intact and dry    Exercises     Shoulder Instructions      Home Living Family/patient expects to be discharged to:: Private residence Living Arrangements: Spouse/significant other Available Help at Discharge: Family;Available PRN/intermittently Type of Home: House Home Access: Stairs to enter [employer] of Steps: 3 Entrance Stairs-Rails: Left Home Layout: One level     Bathroom Shower/Tub: Producer, television/film/video: Standard     Home Equipment: Grab bars - toilet;Grab bars - tub/shower;Shower seat   Additional Comments: girlfriend will be home to help him and his daughter that is 8 months pregnant      Prior Functioning/Environment Level of Independence: Independent        Comments: truck driver         OT Problem List: Decreased strength;Decreased activity tolerance;Impaired balance (sitting and/or standing);Decreased knowledge of use of DME or AE;Decreased knowledge of precautions;Obesity;Pain      OT Treatment/Interventions: Self-care/ADL training;Therapeutic exercise;Neuromuscular education;Energy conservation;DME and/or AE instruction;Manual therapy;Therapeutic activities;Patient/family education;Balance training    OT Goals(Current goals can be found in the care plan section) Acute Rehab OT Goals Patient Stated Goal: to have less pain and wear soft collar OT Goal Formulation: With patient Time For Goal Achievement: 12/24/19 Potential to Achieve Goals: Good  OT Frequency: Min 2X/week   Barriers to D/C:            Co-evaluation              AM-PAC OT "6 Clicks" Daily Activity     Outcome Measure Help from  another person eating meals?: None Help from another person taking care of personal grooming?: None Help from another person toileting, which includes using toliet, bedpan, or urinal?: A Little Help from another person bathing (including washing, rinsing, drying)?: A Little Help from another person to put on and taking off regular upper body clothing?: None Help from another person to put on and taking off regular lower body clothing?: A Little 6 Click Score: 21   End of Session Equipment Utilized During Treatment: Cervical collar Nurse Communication: Mobility status;Precautions;Weight bearing status  Activity Tolerance: Patient tolerated treatment well Patient left: in chair;with call bell/phone within reach;with family/visitor present  OT Visit Diagnosis: Unsteadiness on feet (R26.81);Pain                Time: 1610-9604 OT Time Calculation (min): 47 min Charges:  OT General Charges $OT Visit: 1 Visit OT Evaluation $OT Eval Moderate Complexity: 1 Mod OT Treatments $Self Care/Home Management : 23-37 mins   Brynn, OTR/L  Acute Rehabilitation Services Pager: [phone] Office: [phone] .   Mateo Flow 12/10/2019, 3:46 PM

## 2019-12-10 NOTE — Progress Notes (Signed)
Patient ambulated 180 feet. It was supervised and well tolerated. Pain med offered. Will continue to monitor.

## 2019-12-10 NOTE — Anesthesia Postprocedure Evaluation (Signed)
Anesthesia Post Note  Patient: Philip Stokes  Procedure(s) Performed: Cervical Four to Five, Cervical Five to Six and Cervical Six to Seven ANTERIOR CERVICAL DECOMPRESSION/DISCECTOMY FUSION, ALLOGRAFT, PLATE (Spine Cervical)     Patient location during evaluation: PACU Anesthesia Type: General Level of consciousness: awake and alert Pain management: pain level controlled Vital Signs Assessment: post-procedure vital signs reviewed and stable Respiratory status: spontaneous breathing, nonlabored ventilation, respiratory function stable and patient connected to nasal cannula oxygen Cardiovascular status: blood pressure returned to baseline and stable Postop Assessment: no apparent nausea or vomiting Anesthetic complications: no   No complications documented.  Last Vitals:  Vitals:   12/10/19 0730 12/10/19 1225  BP: 117/61 110/66  Pulse: 80 70  Resp: 18 20  Temp: 36.5 C 36.9 C  SpO2: 96% 94%    Last Pain:  Vitals:   12/10/19 1225  TempSrc: Oral  PainSc:                  Aylana Hirschfeld

## 2019-12-10 NOTE — Progress Notes (Signed)
   Subjective: 1 Day Post-Op Procedure(s): Cervical Four to Five, Cervical Five to Six and Cervical Six to Seven ANTERIOR CERVICAL DECOMPRESSION/DISCECTOMY FUSION, ALLOGRAFT, PLATE Patient reports pain as moderate and severe.    Objective: Vital signs in last 24 hours: Temp:  [97.2 F (36.2 C)-98.4 F (36.9 C)] 97.7 F (36.5 C) (09/14 0730) Pulse Rate:  [68-92] 80 (09/14 0730) Resp:  [12-20] 18 (09/14 0730) BP: (108-154)/(61-99) 117/61 (09/14 0730) SpO2:  [92 %-100 %] 96 % (09/14 0730) Arterial Line BP: (119-152)/(49-61) 119/49 (09/13 1911) Weight:  [133.8 kg] 133.8 kg (09/13 1109)  Intake/Output from previous day: 09/13 0701 - 09/14 0700 In: 1750 [I.V.:1700; IV Piggyback:50] Out: 1295 [Urine:1000; Drains:45; Blood:250] Intake/Output this shift: No intake/output data recorded.  Recent Labs    12/10/19 0342  HGB 13.4   Recent Labs    12/10/19 0342  WBC 11.4*  RBC 4.51  HCT 41.6  PLT 178   Recent Labs    12/09/19 1121 12/10/19 0342  NA 136 137  K 4.2 4.0  CL 103 102  CO2 22 24  BUN 11 8  CREATININE 0.93 0.83  GLUCOSE 410* 148*  CALCIUM 9.3 8.6*   No results for input(s): LABPT, INR in the last 72 hours.  left hand sensation intact , tingling gone.  DG Cervical Spine 2-3 Views  Result Date: 12/09/2019 CLINICAL DATA:  48 year old male undergoing cervical spine surgery. EXAM: DG C-ARM 1-60 MIN; CERVICAL SPINE - 2-3 VIEW CONTRAST:  None. FLUOROSCOPY TIME:  Fluoroscopy Time:  0 minutes 36 seconds Radiation Exposure Index (if provided by the fluoroscopic device): 16.8 mGy Number of Acquired Spot Images: 0 COMPARISON:  Cervical spine MRI 11/28/2019. FINDINGS: Four intraoperative fluoroscopic spot views of the cervical spine. ACDF hardware appears to be at the C4 through C7 levels. No adverse features are evident. IMPRESSION: Intraoperative images of C4 through C7 ACDF. Electronically Signed   By: Odessa Fleming M.D.   On: 12/09/2019 18:24   DG C-Arm 1-60 Min  Result Date:  12/09/2019 CLINICAL DATA:  49 year old male undergoing cervical spine surgery. EXAM: DG C-ARM 1-60 MIN; CERVICAL SPINE - 2-3 VIEW CONTRAST:  None. FLUOROSCOPY TIME:  Fluoroscopy Time:  0 minutes 36 seconds Radiation Exposure Index (if provided by the fluoroscopic device): 16.8 mGy Number of Acquired Spot Images: 0 COMPARISON:  Cervical spine MRI 11/28/2019. FINDINGS: Four intraoperative fluoroscopic spot views of the cervical spine. ACDF hardware appears to be at the C4 through C7 levels. No adverse features are evident. IMPRESSION: Intraoperative images of C4 through C7 ACDF. Electronically Signed   By: Odessa Fleming M.D.   On: 12/09/2019 18:24    Assessment/Plan: 1 Day Post-Op Procedure(s): Cervical Four to Five, Cervical Five to Six and Cervical Six to Seven ANTERIOR CERVICAL DECOMPRESSION/DISCECTOMY FUSION, ALLOGRAFT, PLATE Plan:    Needs aspen , ordered but not in room. Ordered in PACU , HV removed , diabetic meds per hospitalist. Likely discharge either late today or tomorrow if diabetic teaching is done etc.   Eldred Manges 12/10/2019, 8:10 AM

## 2019-12-10 NOTE — TOC Transition Note (Addendum)
Transition of Care [facility]) - CM/SW Discharge Note   Patient Details  Name: Maxximus Gotay MRN: [id number] Date of Birth: Apr 18, 1970  Transition of Care [facility]) CM/SW Contact:  Kermit Balo, RN Phone Number: 12/10/2019, 1:20 PM   Clinical Narrative:    Pt discharging home with self care. Pt has PcP: Bradly Chris. Pt has appt in November and CM encouraged his Significant other to see if the appt can be moved to closer date. She voiced understanding.  Pt without insurance other than the Workers Comp. Pt private pays for PCP. DM team will provide CBG meter for home with supplies.  Pt has assistance at home and transportation to home.    Final next level of care: Home/Self Care Barriers to Discharge: No Barriers Identified   Patient Goals and CMS Choice        Discharge Placement                       Discharge Plan and Services                                     Social Determinants of Health (SDOH) Interventions     Readmission Risk Interventions No flowsheet data found.

## 2019-12-10 NOTE — Progress Notes (Signed)
Patient is refusing insulin. RN educated patient.

## 2019-12-10 NOTE — Progress Notes (Addendum)
Inpatient Diabetes Program Recommendations  AACE/ADA: New Consensus Statement on Inpatient Glycemic Control (2015)  Target Ranges:  Prepandial:   less than 140 mg/dL      Peak postprandial:   less than 180 mg/dL (1-2 hours)      Critically ill patients:  140 - 180 mg/dL   Results for CHAYCE, ROBBINS (MRN [id number]) as of 12/10/2019 08:48  Ref. Range 12/09/2019 11:06 12/09/2019 13:36 12/09/2019 14:38 12/09/2019 15:35 12/09/2019 16:55 12/09/2019 18:10 12/09/2019 21:27  Glucose-Capillary Latest Ref Range: 70 - 99 mg/dL 437 (H) 298 (H)  IV Insulin Drip 194 (H)  IV Insulin Drip 128 (H)  IV Insulin Drip Stopped after 3:30pm 159 (H) 173 (H) 211 (H)   Results for ROB, MCIVER (MRN [id number]) as of 12/10/2019 08:48  Ref. Range 12/10/2019 06:20  Glucose-Capillary Latest Ref Range: 70 - 99 mg/dL 149 (H)   Results for AMMON, MUSCATELLO (MRN [id number]) as of 12/10/2019 08:48  Ref. Range 12/06/2019 09:13  Hemoglobin A1C Latest Ref Range: 4.8 - 5.6 % 9.8 (H)  (234 mg/dl)    Admit for Cervical Decompression/ Discectomy  New Diagnosis of Diabetes found on Admission  Current Orders: Novolog Moderate Correction Scale/ SSI (0-15 units) TID AC      Lantus 10 units Daily      Metformin 500 mg BID    Per Dr. Delories Heinz note, plan is to d/c pt home on Metformin and Amaryl. Will need CBG meter as well. Needs PCP for further diabetes management.   Will order diabetes educational materials for patient and will see pt prior to discharge today to discuss new diagnosis.  Will also order RD consult for nutrition education as well.   Addendum 11:15am--Met w/ pt at bedside this AM.  Pt told me he was over 300# back in 2015.  Was feeling bad in 2015 and had his daughter check his CBG--stated his CBG was >600 on the meter back in 2015.  Stated he made huge changes to his eating habits and lost over 50# and when he checked his CBGs after making these changes he was down in the 120 range.  Has been out of work since May.   Stated he has been eating poorly and has gained weight.  Saw his PCP about 1 month ago (Dr. Laurin Coder with [facility]) and his CBG was 130s at that appt.  Had a DOT physical last Wednesday 09/08 and stated his CBG was >250 at that visit.    Discussed A1C results with patient and explained what an A1C is, basic pathophysiology of DM Type 2, basic home care, basic diabetes diet nutrition principles, importance of checking CBGs and maintaining good CBG control to prevent long-term and short-term complications.  Also reviewed blood sugar goals and A1c goals for home.    Have ordered educational diabetes booklet, RD consult for DM diet education for this patient.  Patient stated his "eyes have been opened" and he plans to make huge changes when he goes home.  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  We discussed that the MD mentioned the possibility of starting Metformin and Amaryl at time of d/c home.  Explained what these two meds are, how they work, side effects, etc.  Pt stated to me he plans to make big changes so he can come off these meds in the future.  Encouraged pt to buy a CBG meter OTC and check his CBGs at home (QAM and 2nd check in the  day either before another meal or 2 hours after a meal).  Pt states he knows how to check his CBGs already.  Gave pt info on American Diabetes Association website and [employer] app on his smart phone as well.     --Will follow patient during hospitalization--  Wyn Quaker RN, MSN, CDE Diabetes Coordinator Inpatient Glycemic Control Team Team Pager: [phone] (8a-5p)

## 2019-12-10 NOTE — Progress Notes (Signed)
Discharge instructions given. Patient verbalized understanding and all questions were answered.  ?

## 2019-12-10 NOTE — Progress Notes (Signed)
Called by Care Management team requesting [employer] CBG meter and supplies for this patient as pt is uninsured and needs CBG meter for home due to new diabetes diagnosis.  Meter kit given to pt with meter, box of 100 strips. Lancet device, and 100 count box of lancets.    Explained how to use and pt stated he already knows how to use a CBG meter.  Explained to pt he can buy replacement strips and lancets at [employer] for very low cost.  Pt stated understanding.    --Will follow patient during hospitalization--  Wyn Quaker RN, MSN, CDE Diabetes Coordinator Inpatient Glycemic Control Team Team Pager: [phone] (8a-5p)

## 2019-12-10 NOTE — Progress Notes (Signed)
Orthopedic Tech Progress Note Patient Details:  Philip Stokes 1971/02/24 [id number] Called [facility] this morning about ASPEN CERVICAL COLLAR. Patient ID: [name], male   DOB: 1970-11-22, 49 y.o.   MRN: [id number]   Donald Pore 12/10/2019, 8:42 AM

## 2019-12-10 NOTE — Progress Notes (Signed)
PROGRESS NOTE    Philip Stokes  ZOX:[id number] DOB: [birth year] DOA: 12/09/2019 PCP: Patient, No Pcp Per    No chief complaint on file.   Brief Narrative: 49 year old gentleman admitted on orthopedics service , came in for cervical stenosis , s/p decompression, started having elevated cbg's. And [facility] consulted for evaluation and management of DM.   Assessment & Plan:   Active Problems:   Cervical spinal stenosis   Obesity, diabetes, and hypertension syndrome (HCC)  Essential Hypertension;  Well controlled.  Resume home meds on discharge.   Diabetes Mellitus: pt reports he was diabetic in 2015.  Possibly metabolic syndrome.   Uncontrolled with hyperglycemia. With A1c of 9.8%. pt is currently on Lantus 10 units, refusing to be discharged on lantus,  Prescriptions ordered for metformin 500 mg BID and amaryl 1 mg daily on discharge.  Patient reports he has a follow up appt in 6 weeks with his PCP and will follow up for meds for DM.  Recommend outpatient follow up / referral to an endocrinologist.  Recommend glucometer and patient education and teaching on how to check cbgs atleast twice daily, once in am prior to eating and once post prandial.   CBG (last 3)  Recent Labs    12/09/19 1810 12/09/19 2127 12/10/19 0620  GLUCAP 173* 211* 149*   Pt can be discharged home on oral metformin and amaryl , glucometer .    Cervical stenosis: S/p Decompression/ Discectomy , further recommendations and management as per Primary.  Pain control.    Morbid obesity.  Body mass index is 41.14 kg/m. Recommend outpatient follow up with PCP regarding diet and exercise ad weight loss.    Tobacco abuse:  Currently on Nicotine patches.     Diabetic Neuropathy? Peripheral neuropathy:  Continue with Gabapentin.         Subjective: No new complaints , wants to go home today.   Objective: Vitals:   12/09/19 2012 12/09/19 2337 12/10/19 0334 12/10/19 0730  BP: 129/74 118/65 108/71  117/61  Pulse: 78 71 72 80  Resp: 17 17 16 18   Temp: 98.4 F (36.9 C) 97.6 F (36.4 C) 97.8 F (36.6 C) 97.7 F (36.5 C)  TempSrc: Oral Oral Oral Oral  SpO2:  100% 97% 96%  Weight:      Height:        Intake/Output Summary (Last 24 hours) at 12/10/2019 1050 Last data filed at 12/10/2019 0533 Gross per 24 hour  Intake 1750 ml  Output 1295 ml  Net 455 ml   Filed Weights   12/09/19 1109  Weight: 133.8 kg    Examination:  General exam: Appears calm and comfortable , not in distress , on Neck collar.   Respiratory system: Clear to auscultation. Respiratory effort normal. Cardiovascular system: S1 & S2 heard, RRR. No JVD, . No pedal edema. Gastrointestinal system: Abdomen is nondistended, soft and nontender.Normal bowel sounds heard. Central nervous system: Alert and oriented. No focal neurological deficits. Extremities: Symmetric 5 x 5 power. Skin: No rashes, lesions or ulcers Psychiatry: Mood & affect appropriate.     Data Reviewed: I have personally reviewed following labs and imaging studies  CBC: Recent Labs  Lab 12/06/19 0913 12/10/19 0342  WBC 10.7* 11.4*  HGB 16.9 13.4  HCT 50.2 41.6  MCV 92.1 92.2  PLT 254 178    Basic Metabolic Panel: Recent Labs  Lab 12/06/19 0913 12/09/19 1121 12/10/19 0342  NA 133* 136 137  K 3.9 4.2 4.0  CL 100  103 102  CO2 22 22 24   GLUCOSE 202* 410* 148*  BUN 10 11 8   CREATININE 0.91 0.93 0.83  CALCIUM 9.8 9.3 8.6*    GFR: Estimated Creatinine Clearance: 151.9 mL/min (by C-G formula based on SCr of 0.83 mg/dL).  Liver Function Tests: Recent Labs  Lab 12/06/19 0913  AST 28  ALT 44  ALKPHOS 123  BILITOT 0.3  PROT RESULTS UNAVAILABLE DUE TO INTERFERING SUBSTANCE  ALBUMIN 4.4    CBG: Recent Labs  Lab 12/09/19 1535 12/09/19 1655 12/09/19 1810 12/09/19 2127 12/10/19 0620  GLUCAP 128* 159* 173* 211* 149*     Recent Results (from the past 240 hour(s))  Surgical pcr screen     Status: None   Collection  Time: 12/06/19  9:13 AM   Specimen: Nasal Mucosa; Nasal Swab  Result Value Ref Range Status   MRSA, PCR NEGATIVE NEGATIVE Final   Staphylococcus aureus NEGATIVE NEGATIVE Final    Comment: (NOTE) The Xpert SA Assay (FDA approved for NASAL specimens in patients 69 years of age and older), is one component of a comprehensive surveillance program. It is not intended to diagnose infection nor to guide or monitor treatment. Performed at [facility], [address], [place], [address] [place]   SARS CORONAVIRUS 2 (TAT 6-24 HRS) Nasopharyngeal Nasopharyngeal Swab     Status: None   Collection Time: 12/07/19  1:55 PM   Specimen: Nasopharyngeal Swab  Result Value Ref Range Status   SARS Coronavirus 2 NEGATIVE NEGATIVE Final    Comment: (NOTE) SARS-CoV-2 target nucleic acids are NOT DETECTED.  The SARS-CoV-2 RNA is generally detectable in upper and lower respiratory specimens during the acute phase of infection. Negative results do not preclude SARS-CoV-2 infection, do not rule out co-infections with other pathogens, and should not be used as the sole basis for treatment or other patient management decisions. Negative results must be combined with clinical observations, patient history, and epidemiological information. The expected result is Negative.  Fact Sheet for Patients: 67893  Fact Sheet for Healthcare Providers: 02/06/20  This test is not yet approved or cleared by the [employer] FDA and  has been authorized for detection and/or diagnosis of SARS-CoV-2 by FDA under an Emergency Use Authorization (EUA). This EUA will remain  in effect (meaning this test can be used) for the duration of the COVID-19 declaration under Se ction 564(b)(1) of the Act, 21 U.S.C. section 360bbb-3(b)(1), unless the authorization is terminated or revoked sooner.  Performed at [facility], [address],  [place], [address] [place]          Radiology Studies: DG Cervical Spine 2-3 Views  Result Date: 12/09/2019 CLINICAL DATA:  49 year old male undergoing cervical spine surgery. EXAM: DG C-ARM 1-60 MIN; CERVICAL SPINE - 2-3 VIEW CONTRAST:  None. FLUOROSCOPY TIME:  Fluoroscopy Time:  0 minutes 36 seconds Radiation Exposure Index (if provided by the fluoroscopic device): 16.8 mGy Number of Acquired Spot Images: 0 COMPARISON:  Cervical spine MRI 11/28/2019. FINDINGS: Four intraoperative fluoroscopic spot views of the cervical spine. ACDF hardware appears to be at the C4 through C7 levels. No adverse features are evident. IMPRESSION: Intraoperative images of C4 through C7 ACDF. Electronically Signed   By: 52 M.D.   On: 12/09/2019 18:24   DG C-Arm 1-60 Min  Result Date: 12/09/2019 CLINICAL DATA:  50 year old male undergoing cervical spine surgery. EXAM: DG C-ARM 1-60 MIN; CERVICAL SPINE - 2-3 VIEW CONTRAST:  None. FLUOROSCOPY TIME:  Fluoroscopy Time:  0 minutes 36 seconds Radiation Exposure Index (if provided by the fluoroscopic device): 16.8 mGy Number of Acquired Spot Images: 0 COMPARISON:  Cervical spine MRI 11/28/2019. FINDINGS: Four intraoperative fluoroscopic spot views of the cervical spine. ACDF hardware appears to be at the C4 through C7 levels. No adverse features are evident. IMPRESSION: Intraoperative images of C4 through C7 ACDF. Electronically Signed   By: Odessa Fleming M.D.   On: 12/09/2019 18:24        Scheduled Meds: . carvedilol  6.25 mg Oral BID  . citalopram  40 mg Oral Daily  . docusate sodium  100 mg Oral BID  . gabapentin  300 mg Oral QHS  . lisinopril  20 mg Oral Daily   And  . hydrochlorothiazide  12.5 mg Oral Daily  . insulin aspart  0-15 Units Subcutaneous TID WC  . insulin glargine  10 Units Subcutaneous Daily  . metFORMIN  500 mg Oral BID WC  . nicotine  21 mg Transdermal Daily  . pantoprazole  40 mg Oral QHS  . polyethylene glycol  17 g Oral Daily  . sodium  chloride flush  3 mL Intravenous Q12H   Continuous Infusions: . sodium chloride 75 mL/hr at 12/09/19 2021  . sodium chloride    . methocarbamol (ROBAXIN) IV       LOS: 1 day        Kathlen Mody, MD [facility]   To contact the attending provider between 7A-7P or the covering provider during after hours 7P-7A, please log into the web site [email or web address] and access using universal [place] password for that web site. If you do not have the password, please call the hospital operator.  12/10/2019, 10:50 AM

## 2019-12-17 ENCOUNTER — Encounter: Payer: Self-pay | Admitting: Orthopaedic Surgery

## 2019-12-17 ENCOUNTER — Ambulatory Visit: Payer: Self-pay

## 2019-12-17 ENCOUNTER — Ambulatory Visit (INDEPENDENT_AMBULATORY_CARE_PROVIDER_SITE_OTHER): Payer: Worker's Compensation | Admitting: Orthopaedic Surgery

## 2019-12-17 VITALS — BP 140/91 | HR 65 | Ht 71.0 in | Wt 295.0 lb

## 2019-12-17 DIAGNOSIS — Z981 Arthrodesis status: Secondary | ICD-10-CM | POA: Diagnosis not present

## 2019-12-17 MED ORDER — METHOCARBAMOL 500 MG PO TABS: 500.0000 mg | ORAL_TABLET | Freq: Four times a day (QID) | ORAL | 0 refills | Status: AC | PRN

## 2019-12-17 MED ORDER — OXYCODONE-ACETAMINOPHEN 5-325 MG PO TABS
1.0000 | ORAL_TABLET | Freq: Four times a day (QID) | ORAL | 0 refills | Status: AC | PRN
Start: 2019-12-17 — End: 2020-12-16

## 2019-12-17 NOTE — Progress Notes (Signed)
   Post-Op Visit Note   Patient: Philip Stokes           Date of Birth: [birth year]           MRN: [id number] Visit Date: 12/17/2019 PCP: Patient, No Pcp Per   Assessment & Plan: Postop 3 level cervical fusion for cervical stenosis with early myelopathic symptoms.  He states the numbness and tingling in his hands and legs is gone.  He noticed immediate relief after the surgery.  X-ray showed good position alignment.  Work slip given no work x6 weeks.  Recheck 5 weeks with lateral flexion-extension x-ray on return.  Chief Complaint:  Chief Complaint  Patient presents with  . Neck - Routine Post Op    12/09/2019 C4-5,C5-6,C6-7 ACDF   Visit Diagnoses:  1. Status post cervical spinal fusion     Plan: Work slip given no work x6 weeks.  Return 5 weeks for lateral flexion-extension C-spine x-ray and AP x-ray cervical spine. Follow-Up Instructions: No follow-ups on file.   Orders:  Orders Placed This Encounter  Procedures  . XR Cervical Spine 2 or 3 views   No orders of the defined types were placed in this encounter.   Imaging: No results found.  PMFS History: Patient Active Problem List   Diagnosis Date Noted  . Cervical stenosis of spine 12/10/2019  . Spinal stenosis of cervical region 12/09/2019  . Foraminal stenosis of cervical region 12/09/2019  . Cervical spinal stenosis 12/09/2019  . Obesity, diabetes, and hypertension syndrome (HCC)   . Other spondylosis with radiculopathy, cervical region 11/11/2019   Past Medical History:  Diagnosis Date  . Anxiety   . Depression   . Diabetes mellitus without complication (HCC)   . Hypertension   . Pneumonia     No family history on file.  Past Surgical History:  Procedure Laterality Date  . ANTERIOR CERVICAL DECOMP/DISCECTOMY FUSION  12/09/2019   Procedure: Cervical Four to Five, Cervical Five to Six and Cervical Six to Seven ANTERIOR CERVICAL DECOMPRESSION/DISCECTOMY FUSION, ALLOGRAFT, PLATE;  Surgeon: Eldred Manges, MD;   Location: [facility] OR;  Service: Orthopedics;;  . NO PAST SURGERIES     Social History   Occupational History  . Not on file  Tobacco Use  . Smoking status: Current Every Day Smoker    Packs/day: 3.00  . Smokeless tobacco: Never Used  Vaping Use  . Vaping Use: Never used  Substance and Sexual Activity  . Alcohol use: Not Currently  . 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 Sexual activity: Not on file

## 2019-12-17 NOTE — Discharge Summary (Signed)
Patient ID: Philip Stokes MRN: [id number] DOB/AGE: 1970/05/22 49 y.o.  Admit date: 12/09/2019 Discharge date: 12/17/2019  Admission Diagnoses:  Active Problems:   Foraminal stenosis of cervical region   Cervical spinal stenosis   Obesity, diabetes, and hypertension syndrome (HCC)   Cervical stenosis of spine   Discharge Diagnoses:  Active Problems:   Foraminal stenosis of cervical region   Cervical spinal stenosis   Obesity, diabetes, and hypertension syndrome (HCC)   Cervical stenosis of spine  status post Procedure(s): Cervical Four to Five, Cervical Five to Six and Cervical Six to Seven ANTERIOR CERVICAL DECOMPRESSION/DISCECTOMY FUSION, ALLOGRAFT, PLATE  Past Medical History:  Diagnosis Date  . Anxiety   . Depression   . Diabetes mellitus without complication (HCC)   . Hypertension   . Pneumonia     Surgeries: Procedure(s): Cervical Four to Five, Cervical Five to Six and Cervical Six to Seven ANTERIOR CERVICAL DECOMPRESSION/DISCECTOMY FUSION, ALLOGRAFT, PLATE on 08/23/4130   Consultants:   Discharged Condition: Improved  Hospital Course: Philip Stokes is an 49 y.o. male who was admitted 12/09/2019 for operative treatment of cervical stenosis.  Patient failed conservative treatments (please see the history and physical for the specifics) and had severe unremitting pain that affects sleep, daily activities and work/hobbies. After pre-op clearance, the patient was taken to the operating room on 12/09/2019 and underwent  Procedure(s): Cervical Four to Five, Cervical Five to Six and Cervical Six to Seven ANTERIOR CERVICAL DECOMPRESSION/DISCECTOMY FUSION, ALLOGRAFT, PLATE.    Patient was given perioperative antibiotics:  Anti-infectives (From admission, onward)   Start     Dose/Rate Route Frequency Ordered Stop   12/09/19 2100  ceFAZolin (ANCEF) IVPB 1 g/50 mL premix        1 g 100 mL/hr over 30 Minutes Intravenous Every 8 hours 12/09/19 2007 12/10/19 0624   12/09/19 0600   ceFAZolin (ANCEF) 3 g in dextrose 5 % 50 mL IVPB        3 g 100 mL/hr over 30 Minutes Intravenous On call to O.R. 12/06/19 0715 12/09/19 1317       Patient was given sequential compression devices and early ambulation to prevent DVT.   Patient benefited maximally from hospital stay and there were no complications. At the time of discharge, the patient was urinating/moving their bowels without difficulty, tolerating a regular diet, pain is controlled with oral pain medications and they have been cleared by PT/OT.   Recent vital signs: No data found.   Recent laboratory studies: No results for input(s): WBC, HGB, HCT, PLT, NA, K, CL, CO2, BUN, CREATININE, GLUCOSE, INR, CALCIUM in the last 72 hours.  Invalid input(s): PT, 2   Discharge Medications:   Allergies as of 12/10/2019   No Known Allergies     Medication List    STOP taking these medications   oxyCODONE-acetaminophen 5-325 MG tablet Commonly known as: PERCOCET/ROXICET     TAKE these medications   ALPRAZolam 1 MG tablet Commonly known as: XANAX Take 1 tablet (1 mg total) by mouth at bedtime as needed for anxiety.   carvedilol 6.25 MG tablet Commonly known as: COREG Take 6.25 mg by mouth 2 (two) times daily.   citalopram 40 MG tablet Commonly known as: CELEXA Take 40 mg by mouth daily.   gabapentin 300 MG capsule Commonly known as: NEURONTIN Take 300 mg by mouth at bedtime.   glimepiride 1 MG tablet Commonly known as: AMARYL Take 1 tablet (1 mg total) by mouth daily with breakfast.   lisinopril-hydrochlorothiazide 20-12.5  MG tablet Commonly known as: ZESTORETIC Take 1 tablet by mouth daily.   metFORMIN 500 MG tablet Commonly known as: GLUCOPHAGE Take 1 tablet (500 mg total) by mouth 2 (two) times daily with a meal.   pantoprazole 40 MG tablet Commonly known as: PROTONIX Take 40 mg by mouth at bedtime.   zolpidem 10 MG tablet Commonly known as: AMBIEN Take 10 mg by mouth at bedtime as needed for  sleep.       Diagnostic Studies: DG Chest 2 View  Result Date: 12/06/2019 CLINICAL DATA:  Pre-admission examination. Patient for cervical fusion. EXAM: CHEST - 2 VIEW COMPARISON:  None. FINDINGS: The lungs are clear. Heart size is normal. No pneumothorax or pleural fluid. No acute or focal bony abnormality. IMPRESSION: Negative chest. Electronically Signed   By: Drusilla Kanner M.D.   On: 12/06/2019 09:40   DG Cervical Spine 2-3 Views  Result Date: 12/09/2019 CLINICAL DATA:  49 year old male undergoing cervical spine surgery. EXAM: DG C-ARM 1-60 MIN; CERVICAL SPINE - 2-3 VIEW CONTRAST:  None. FLUOROSCOPY TIME:  Fluoroscopy Time:  0 minutes 36 seconds Radiation Exposure Index (if provided by the fluoroscopic device): 16.8 mGy Number of Acquired Spot Images: 0 COMPARISON:  Cervical spine MRI 11/28/2019. FINDINGS: Four intraoperative fluoroscopic spot views of the cervical spine. ACDF hardware appears to be at the C4 through C7 levels. No adverse features are evident. IMPRESSION: Intraoperative images of C4 through C7 ACDF. Electronically Signed   By: Odessa Fleming M.D.   On: 12/09/2019 18:24   MR Cervical Spine w/o contrast  Result Date: 11/28/2019 CLINICAL DATA:  Spinal stenosis with progressive left hand weakness/numbness. Gait disturbance, falls. EXAM: MRI CERVICAL SPINE WITHOUT CONTRAST TECHNIQUE: Multiplanar, multisequence MR imaging of the cervical spine was performed. No intravenous contrast was administered. COMPARISON:  Cervical radiographs 11/05/2019 FINDINGS: Alignment: Physiologic. Vertebrae: No abnormal bone marrow signal to suggest discitis, fracture, or abnormal bone lesion. Cord: Normal signal. Posterior Fossa, vertebral arteries, paraspinal tissues: Negative. Disc levels: C2-C3: Bilateral uncovertebral and facet hypertrophy contributes to moderate bilateral foraminal stenosis. No substantial canal stenosis. C3-C4: Right eccentric posterior disc osteophyte complex with right greater than left  uncovertebral and facet hypertrophy results in severe bilateral foraminal stenosis. Right eccentric disc contacts and compresses the right eccentric cord with severe right eccentric canal stenosis (see series 8, image 11). Overall canal stenosis is moderate. C4-C5: Left eccentric posterior disc osteophyte complex with left greater than right facet and uncovertebral hypertrophy. Resulting severe left foraminal stenosis and moderate to severe left eccentric canal stenosis. Mild right foraminal stenosis. C5-C6: Left eccentric posterior disc osteophyte complex with left greater than right uncovertebral facet hypertrophy. Findings result in left eccentric moderate to severe canal stenosis and severe and moderate to severe right foraminal stenosis. C6-C7 posterior disc osteophyte complex and bilateral facet uncovertebral hypertrophy. Resulting moderate canal stenosis and severe left and moderate right bilateral foraminal stenosis. IMPRESSION: 1. Severe foraminal stenosis on the left at C4-C5, C5-C6, and C6-C7. Moderate to severe right foraminal stenosis at C5-C6. Moderate foraminal stenosis bilaterally at C2-C3 and on the right at C6-C7. 2. Severe right centric canal stenosis at C3-C4 where disc contacts and compresses the right eccentric cord. Moderate to severe canal stenosis at C4-C5 and C5-C6 and moderate canal stenosis C6-C7. No abnormal cord signal. Electronically Signed   By: Feliberto Harts MD   On: 11/28/2019 11:34   DG C-Arm 1-60 Min  Result Date: 12/09/2019 CLINICAL DATA:  49 year old male undergoing cervical spine surgery. EXAM: DG C-ARM 1-60  MIN; CERVICAL SPINE - 2-3 VIEW CONTRAST:  None. FLUOROSCOPY TIME:  Fluoroscopy Time:  0 minutes 36 seconds Radiation Exposure Index (if provided by the fluoroscopic device): 16.8 mGy Number of Acquired Spot Images: 0 COMPARISON:  Cervical spine MRI 11/28/2019. FINDINGS: Four intraoperative fluoroscopic spot views of the cervical spine. ACDF hardware appears to be at  the C4 through C7 levels. No adverse features are evident. IMPRESSION: Intraoperative images of C4 through C7 ACDF. Electronically Signed   By: Odessa Fleming M.D.   On: 12/09/2019 18:24   XR Cervical Spine 2 or 3 views  Result Date: 12/17/2019 AP lateral cervical spine x-rays are obtained and reviewed.  This shows 3 level cervical fusion with allograft plate R4-Y7.  Good position and alignment. Impression: Satisfactory 3 level cervical fusion C4-5, C5-6, C6-7.   Discharge Instructions    Incentive spirometry RT   Complete by: As directed        Follow-up Information    Schedule an appointment as soon as possible for a visit with Eldred Manges, MD.   Specialty: Orthopedic Surgery Why: needs return office visit one week postop.  call to schedule appointment with Dr Ophelia Charter.  Contact information: [address] [place] [address] [phone]               Discharge Plan:  discharge to home Disposition:     Signed: Zonia Kief  12/17/2019, 1:33 PM

## 2019-12-30 ENCOUNTER — Telehealth: Payer: Self-pay | Admitting: Orthopaedic Surgery

## 2019-12-30 ENCOUNTER — Telehealth: Payer: Self-pay

## 2019-12-30 NOTE — Telephone Encounter (Signed)
Patient called asking for an update about medication refill. Please send in medication. Please call patient at [phone].

## 2019-12-30 NOTE — Telephone Encounter (Signed)
Please advise 

## 2019-12-30 NOTE — Telephone Encounter (Signed)
Patient called he needs prescription refilled for oxycodone and methocarbamol call [phone]

## 2019-12-31 ENCOUNTER — Other Ambulatory Visit: Payer: Self-pay | Admitting: Orthopaedic Surgery

## 2019-12-31 MED ORDER — METHOCARBAMOL 500 MG PO TABS: 500.0000 mg | ORAL_TABLET | Freq: Three times a day (TID) | ORAL | 0 refills | Status: AC | PRN

## 2019-12-31 MED ORDER — TRAMADOL HCL 50 MG PO TABS: 50.0000 mg | ORAL_TABLET | Freq: Four times a day (QID) | ORAL | 0 refills | Status: AC | PRN

## 2019-12-31 NOTE — Progress Notes (Signed)
Requested percocet and robaxin for soreness. I called , time to wean pain meds, . Still anxious , problems sleeping. Ultram sent in , girlfriend in background states she takes ultram . Continue collar, ROV as scheduled.

## 2019-12-31 NOTE — Telephone Encounter (Signed)
Philip Stokes patient

## 2019-12-31 NOTE — Telephone Encounter (Signed)
Double telephone encounter.. message already was sent to dr. Ophelia Charter

## 2020-01-21 ENCOUNTER — Encounter: Payer: Self-pay | Admitting: Orthopaedic Surgery

## 2020-01-21 ENCOUNTER — Other Ambulatory Visit: Payer: Self-pay

## 2020-01-21 ENCOUNTER — Ambulatory Visit: Payer: Self-pay | Admitting: Orthopaedic Surgery

## 2020-01-21 ENCOUNTER — Ambulatory Visit (INDEPENDENT_AMBULATORY_CARE_PROVIDER_SITE_OTHER): Payer: Worker's Compensation | Admitting: Orthopaedic Surgery

## 2020-01-21 ENCOUNTER — Ambulatory Visit (INDEPENDENT_AMBULATORY_CARE_PROVIDER_SITE_OTHER): Payer: Worker's Compensation

## 2020-01-21 VITALS — Ht 71.0 in | Wt 295.0 lb

## 2020-01-21 DIAGNOSIS — Z981 Arthrodesis status: Secondary | ICD-10-CM

## 2020-01-21 MED ORDER — OXYCODONE-ACETAMINOPHEN 5-325 MG PO TABS: 1.0000 | ORAL_TABLET | ORAL | 0 refills | Status: AC | PRN

## 2020-01-21 NOTE — Progress Notes (Signed)
Office Visit Note   Patient: Philip Stokes           Date of Birth: Apr 02, 1970           MRN: [id number] Visit Date: 01/21/2020              Requested by: No referring provider defined for this encounter. PCP: Patient, No Pcp Per   Assessment & Plan: Visit Diagnoses:  1. Status post cervical spinal fusion     Plan: Percocet 20 tablets given he will not get any refills we discussed this with him and a detailed conversation.  We will set him up for some physical therapy for strengthening and work note given no work x1 month.  Follow-Up Instructions: Return in about 1 month (around 02/21/2020).   Orders:  Orders Placed This Encounter  Procedures  . XR Cervical Spine 2 or 3 views  . Ambulatory referral to Physical Therapy   Meds ordered this encounter  Medications  . oxyCODONE-acetaminophen (PERCOCET/ROXICET) 5-325 MG tablet    Sig: Take 1 tablet by mouth every 4 (four) hours as needed for severe pain.    Dispense:  20 tablet    Refill:  0      Procedures: No procedures performed   Clinical Data: No additional findings.   Subjective: Chief Complaint  Patient presents with  . Neck - Follow-up    12/09/2019 C4-5, C5-6, C6-7 ACDF    HPI 49 year old male returns post 3 level cervical fusion on 12/09/2019.  He states he has had improvement in the pain that he was having preop but still has weakness in his arm and pain in his shoulders and cannot get comfortable.  He tried tramadol without relief.  He states the Percocet helped his pain and he has had Percocet in the past off and on.  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 reimbursement is less.  He has been wearing his collar as instructed and neck incisions well-healed.  He states he has pain in his trapezial region not in his shoulders.  He is walking better.  He is not having falls since he was seen.  Review of Systems updated unchanged.   Objective: Vital  Signs: Ht 5\' 11"  (1.803 m)   Wt 295 lb (133.8 kg)   BMI 41.14 kg/m   Physical Exam Constitutional:      Appearance: He is well-developed.  HENT:     Head: Normocephalic and atraumatic.  Eyes:     Pupils: Pupils are equal, round, and reactive to light.  Neck:     Thyroid: No thyromegaly.     Trachea: No tracheal deviation.  Cardiovascular:     Rate and Rhythm: Normal rate.  Pulmonary:     Effort: Pulmonary effort is normal.     Breath sounds: No wheezing.  Abdominal:     General: Bowel sounds are normal.     Palpations: Abdomen is soft.  Skin:    General: Skin is warm and dry.     Capillary Refill: Capillary refill takes less than 2 seconds.  Neurological:     Mental Status: He is alert and oriented to person, place, and time.  Psychiatric:        Behavior: Behavior normal.        Thought Content: Thought content normal.        Judgment: Judgment normal.     Ortho Exam neck incision well-healed.  Normal heel toe gait.  Is  able to heel walk and toe walk.  Specialty Comments:  No specialty comments available.  Imaging: XR Cervical Spine 2 or 3 views  Result Date: 01/22/2020 Flexion-extension cervical spine x-rays and AP x-ray demonstrates no motion with 3 level cervical fusion C4-C7.  He has at least partial incorporation above and below the graft at all 3 levels but still has some areas that have not filled then. Impression: Post 3 level cervical fusion C4-C7 with partial graft incorporation and no motion on flexion-extension x-rays.    PMFS History: Patient Active Problem List   Diagnosis Date Noted  . Cervical stenosis of spine 12/10/2019  . Spinal stenosis of cervical region 12/09/2019  . Foraminal stenosis of cervical region 12/09/2019  . Cervical spinal stenosis 12/09/2019  . Obesity, diabetes, and hypertension syndrome (HCC)   . Other spondylosis with radiculopathy, cervical region 11/11/2019   Past Medical History:  Diagnosis Date  . Anxiety   .  Depression   . Diabetes mellitus without complication (HCC)   . Hypertension   . Pneumonia     No family history on file.  Past Surgical History:  Procedure Laterality Date  . ANTERIOR CERVICAL DECOMP/DISCECTOMY FUSION  12/09/2019   Procedure: Cervical Four to Five, Cervical Five to Six and Cervical Six to Seven ANTERIOR CERVICAL DECOMPRESSION/DISCECTOMY FUSION, ALLOGRAFT, PLATE;  Surgeon: Eldred Manges, MD;  Location: [facility] OR;  Service: Orthopedics;;  . NO PAST SURGERIES     Social History   Occupational History  . Not on file  Tobacco Use  . Smoking status: Current Every Day Smoker    Packs/day: 3.00  . Smokeless tobacco: Never Used  Vaping Use  . Vaping Use: Never used  Substance and Sexual Activity  . Alcohol use: Not Currently  . 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 Sexual activity: Not on file

## 2020-01-27 ENCOUNTER — Telehealth: Payer: Self-pay | Admitting: Radiology

## 2020-01-27 NOTE — Telephone Encounter (Signed)
Eugenia [email or web address] [email or web address]  Wed 01/22/2020 1:51 PM   Philip Stokes is request the work note be revised to state that he is to remain out of work until 11/30 (his next appointment). This would ensue is paid until that date.     Thank you for the opportunity to assist!   Eugenia Mcalpine, RN  Medical Case Manager  O [phone]   C [phone]   F [phone]  [employer]     Per Dr. Ophelia Charter, ok for note. Note entered.

## 2020-02-07 ENCOUNTER — Other Ambulatory Visit: Payer: Self-pay | Admitting: Orthopaedic Surgery

## 2020-02-07 ENCOUNTER — Telehealth: Payer: Self-pay

## 2020-02-07 NOTE — Telephone Encounter (Signed)
Nurse called stating that patient is having severe pain that is causing his BP to increase and is now on a 3rd BP medication.  Stated that patient is not able to do therapy due to pain and would like to know if patient can get a stronger Rx or be referred to a pain clinic?  Cb# [phone].  Please advise.  Thank you.

## 2020-02-07 NOTE — Telephone Encounter (Signed)
Attempted to call her. Left message. I left my cell phone she can call me back. 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 He had an anterior cervical fusion and usually would be off pain medication 1 to 2 weeks. He is taken considerable more narcotics than normal and profile with sedatives with some increased risk for overdose, [place] site reviewed once again. When I saw him last time I told him I would give him 20 tablets and he would not get additional medication. When she calls me I will discuss with her in detail that I am not referring him to a pain clinic and he does not need to continue taking narcotic pain medication.FYI

## 2020-02-11 ENCOUNTER — Ambulatory Visit: Payer: Self-pay | Admitting: Orthopaedic Surgery

## 2020-02-11 ENCOUNTER — Ambulatory Visit (INDEPENDENT_AMBULATORY_CARE_PROVIDER_SITE_OTHER): Payer: Worker's Compensation

## 2020-02-11 ENCOUNTER — Ambulatory Visit (INDEPENDENT_AMBULATORY_CARE_PROVIDER_SITE_OTHER): Payer: Worker's Compensation | Admitting: Orthopaedic Surgery

## 2020-02-11 ENCOUNTER — Encounter: Payer: Self-pay | Admitting: Orthopaedic Surgery

## 2020-02-11 VITALS — BP 134/84 | HR 84 | Ht 71.0 in | Wt 282.0 lb

## 2020-02-11 DIAGNOSIS — Z981 Arthrodesis status: Secondary | ICD-10-CM | POA: Diagnosis not present

## 2020-02-11 NOTE — Progress Notes (Addendum)
Office Visit Note   Patient: Philip Stokes           Date of Birth: 1970/09/14           MRN: [id number] Visit Date: 02/11/2020              Requested by: No referring provider defined for this encounter. PCP: Patient, No Pcp Per   Assessment & Plan: Visit Diagnoses:  1. Status post cervical spinal fusion     Plan: Patient is gotten good relief of preop triceps weakness and left arm pain.  He still has some discomfort that radiates into his left shoulder particularly if he has his arms up over his head or if he is doing repetitive pushing.  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  Patient return in 6 weeks for assignment of impairment rating.  We reviewed his x-rays today.  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  Eugenia Mcalpine fax number [phone] was present for our discussion today.  Plan on signing him a rating on return in preliminary  impairment rating using [place] rating scale would be 14.3% of the back for 3 level cervical fusion.   Follow-Up Instructions: Return in about 6 weeks (around 03/24/2020).   Orders:  Orders Placed This Encounter  Procedures  . XR Cervical Spine 2 or 3 views   No orders of the defined types were placed in this encounter.     Procedures: No procedures performed   Clinical Data: No additional findings.   Subjective: Chief Complaint  Patient presents with  . Neck - Follow-up    12/09/2019 C4-5, C5-6, C6-7 ACDF    HPI patient returns post 3 level cervical fusion done on 12/09/2019.  Flexion-extension x-rays show no motion.  Patient has been through physical therapy and states he is exceeded everything that is been asked of him and he states he is ready to resume work and driving.  He is lost 20 pounds A1c has significantly improved now 7.2.  Review of Systems updated unchanged other than as mentioned in HPI.   Objective: Vital  Signs: BP 134/84   Pulse 84   Ht 5\' 11"  (1.803 m)   Wt 282 lb (127.9 kg)   BMI 39.33 kg/m   Physical Exam Constitutional:      Appearance: He is well-developed.  HENT:     Head: Normocephalic and atraumatic.  Eyes:     Pupils: Pupils are equal, round, and reactive to light.  Neck:     Thyroid: No thyromegaly.     Trachea: No tracheal deviation.  Cardiovascular:     Rate and Rhythm: Normal rate.  Pulmonary:     Effort: Pulmonary effort is normal.     Breath sounds: No wheezing.  Abdominal:     General: Bowel sounds are normal.     Palpations: Abdomen is soft.  Skin:    General: Skin is warm and dry.     Capillary Refill: Capillary refill takes less than 2 seconds.  Neurological:     Mental Status: He is alert and oriented to person, place, and time.  Psychiatric:        Behavior: Behavior normal.        Thought Content: Thought content normal.        Judgment: Judgment normal.     Ortho Exam patient has good grip strength.  Biceps triceps strength is  strong normal supination pronation.  Upper semireflexes right and left are 1+ and symmetrical.  Normal heel toe gait. Specialty Comments:  No specialty comments available.  Imaging: XR Cervical Spine 2 or 3 views  Result Date: 02/11/2020 AP lateral cervical spine x-rays with lateral flexion-extension images are obtained and reviewed.  This shows C4-C7 anterior fusion 3 level with plate and screws.  No motion is detected on flexion-extension views.  There is progressive graft incorporation. Impression: Satisfactory C4-5, C5-6, C6-7 3 level cervical fusion.    PMFS History: Patient Active Problem List   Diagnosis Date Noted  . Cervical stenosis of spine 12/10/2019  . Spinal stenosis of cervical region 12/09/2019  . Foraminal stenosis of cervical region 12/09/2019  . Cervical spinal stenosis 12/09/2019  . Obesity, diabetes, and hypertension syndrome (HCC)   . Other spondylosis with radiculopathy, cervical region  11/11/2019   Past Medical History:  Diagnosis Date  . Anxiety   . Depression   . Diabetes mellitus without complication (HCC)   . Hypertension   . Pneumonia     No family history on file.  Past Surgical History:  Procedure Laterality Date  . ANTERIOR CERVICAL DECOMP/DISCECTOMY FUSION  12/09/2019   Procedure: Cervical Four to Five, Cervical Five to Six and Cervical Six to Seven ANTERIOR CERVICAL DECOMPRESSION/DISCECTOMY FUSION, ALLOGRAFT, PLATE;  Surgeon: Eldred Manges, MD;  Location: [facility] OR;  Service: Orthopedics;;  . NO PAST SURGERIES     Social History   Occupational History  . Not on file  Tobacco Use  . Smoking status: Current Every Day Smoker    Packs/day: 3.00  . Smokeless tobacco: Never Used  Vaping Use  . Vaping Use: Never used  Substance and Sexual Activity  . Alcohol use: Not Currently  . 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 Sexual activity: Not on file

## 2020-02-25 ENCOUNTER — Ambulatory Visit: Payer: Self-pay | Admitting: Orthopaedic Surgery

## 2020-03-02 ENCOUNTER — Telehealth: Payer: Self-pay | Admitting: Orthopaedic Surgery

## 2020-03-02 NOTE — Telephone Encounter (Signed)
Please advise 

## 2020-03-02 NOTE — Telephone Encounter (Signed)
Pt called stating his neck is bone on bone and if he could get an injection

## 2020-03-02 NOTE — Telephone Encounter (Signed)
I called discussed.  

## 2020-03-24 ENCOUNTER — Ambulatory Visit: Payer: Self-pay | Admitting: Orthopaedic Surgery

## 2020-04-07 ENCOUNTER — Encounter: Payer: Self-pay | Admitting: Orthopaedic Surgery

## 2020-04-07 ENCOUNTER — Ambulatory Visit: Payer: Self-pay

## 2020-04-07 ENCOUNTER — Other Ambulatory Visit: Payer: Self-pay

## 2020-04-07 ENCOUNTER — Ambulatory Visit (INDEPENDENT_AMBULATORY_CARE_PROVIDER_SITE_OTHER): Payer: Worker's Compensation | Admitting: Orthopaedic Surgery

## 2020-04-07 VITALS — BP 147/91 | HR 98 | Ht 71.0 in | Wt 280.0 lb

## 2020-04-07 DIAGNOSIS — Z981 Arthrodesis status: Secondary | ICD-10-CM

## 2020-04-07 DIAGNOSIS — M25571 Pain in right ankle and joints of right foot: Secondary | ICD-10-CM

## 2020-04-07 DIAGNOSIS — M542 Cervicalgia: Secondary | ICD-10-CM
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
## 2020-06-03 ENCOUNTER — Other Ambulatory Visit: Payer: Self-pay | Admitting: Orthopaedic Surgery

## 2021-03-15 IMAGING — CR DG CHEST 2V
2 series · 2 of 2 positions shown · non-contrast
Comparison: None.

CLINICAL DATA: Pre-admission examination. Patient for cervical
fusion.

EXAM:
CHEST - 2 VIEW

[w chest pa]
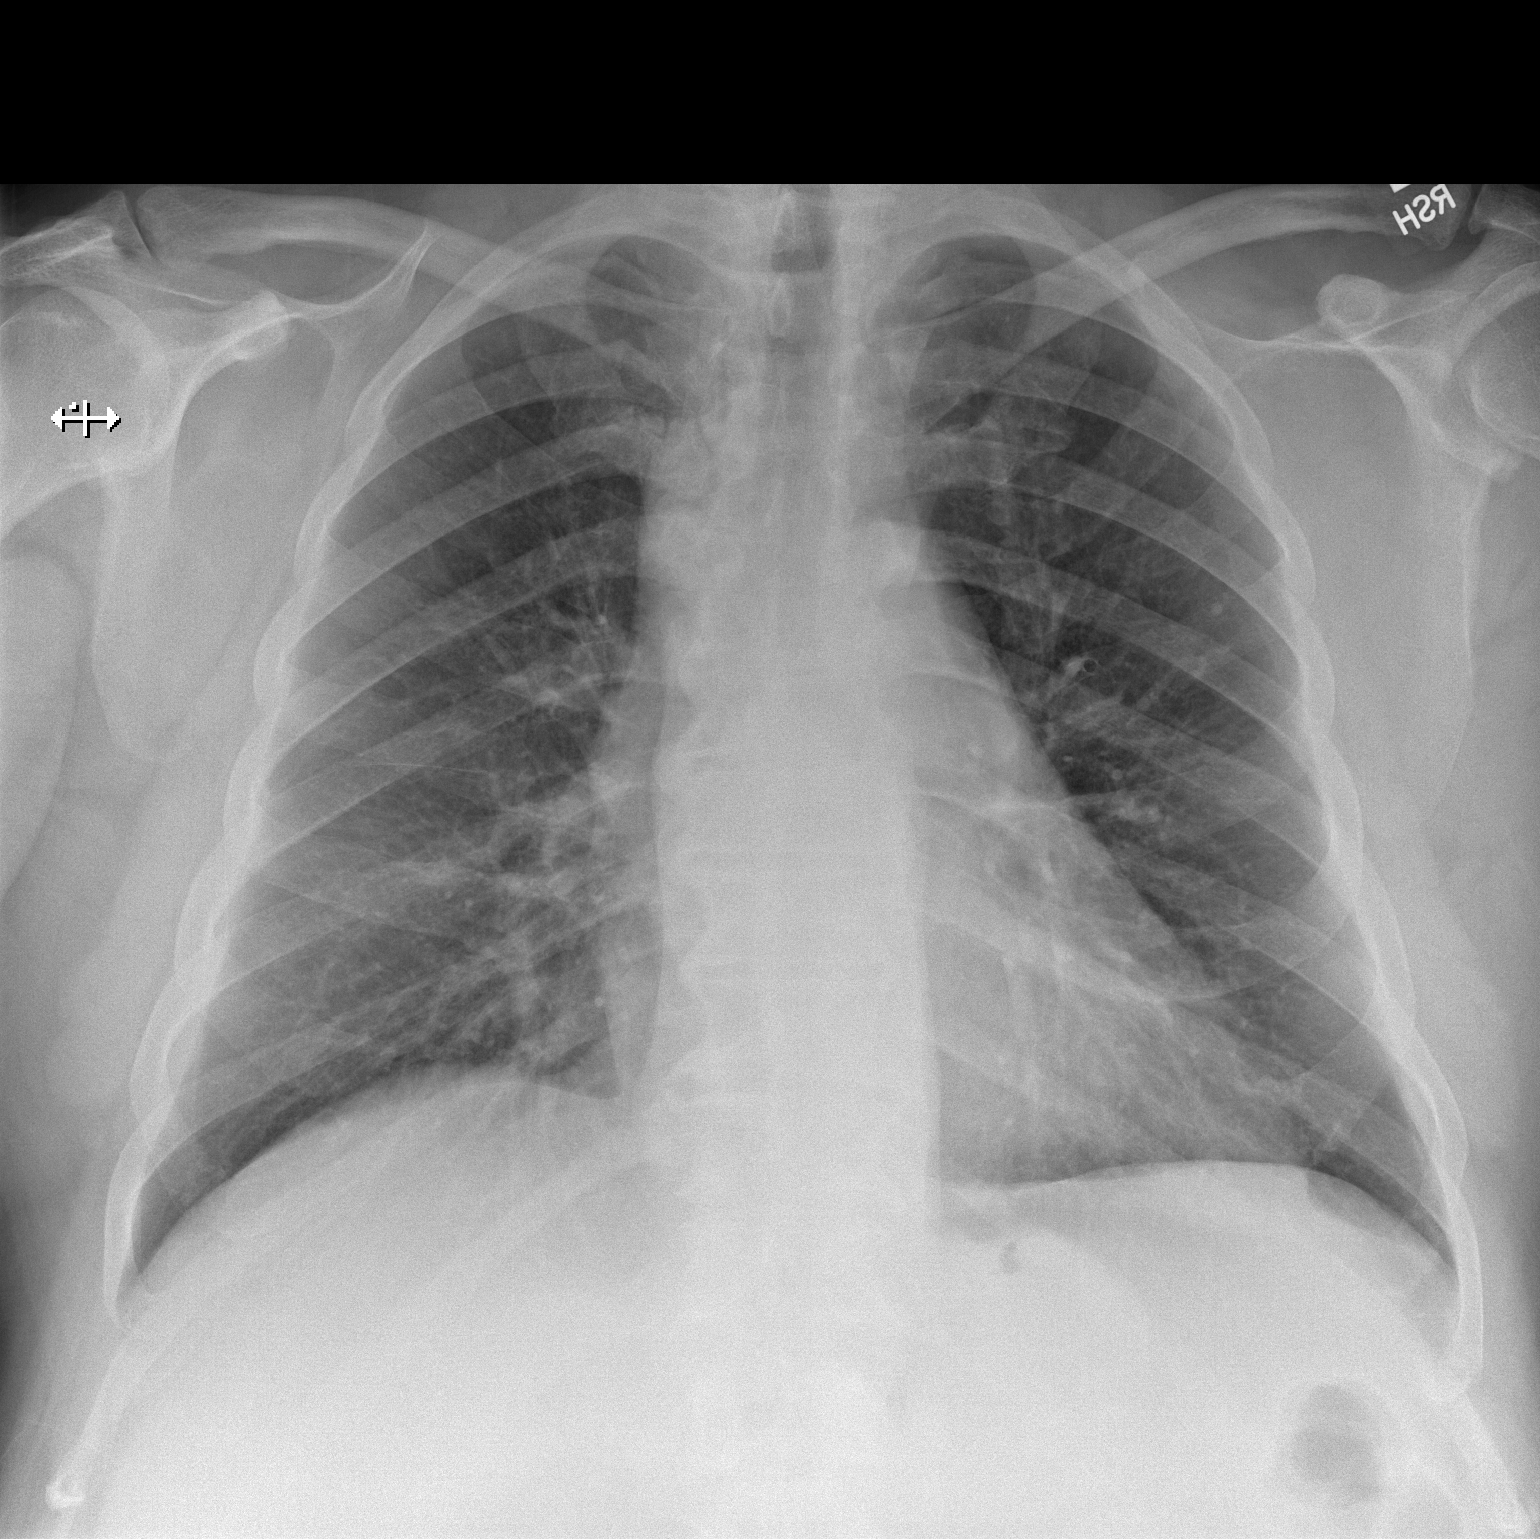

[w chest lat]
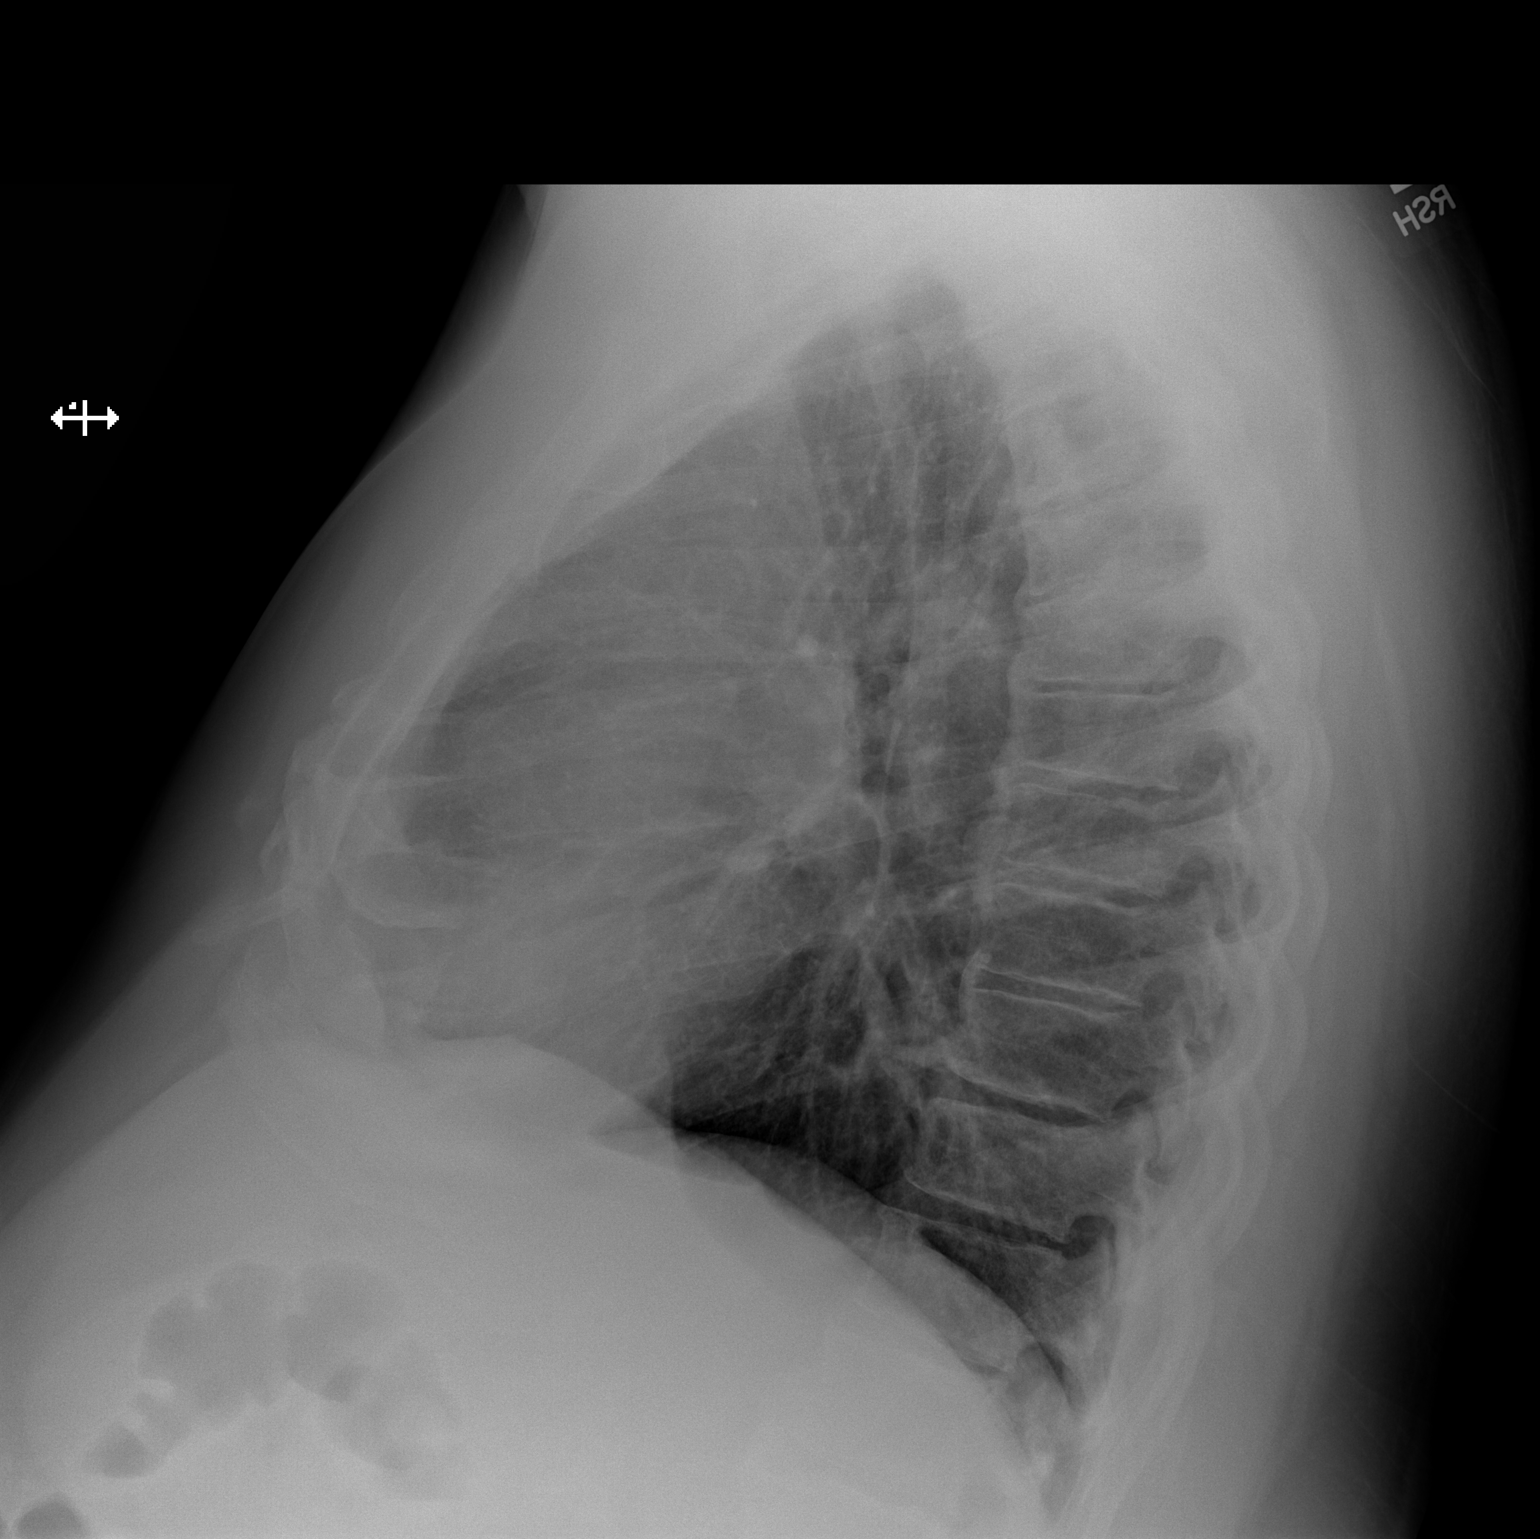

[2 of 2 positions shown; findings below may reference images not displayed]

FINDINGS: The lungs are clear. Heart size is normal. No pneumothorax or
pleural fluid. No acute or focal bony abnormality.
IMPRESSION: Negative chest.

## 2021-03-18 IMAGING — RF DG C-ARM 1-60 MIN
1 series · 4 of 4 positions shown · IV contrast (agent unspecified)
Comparison: Cervical spine MRI 11/28/2019.

CLINICAL DATA: 49-year-old male undergoing cervical spine surgery.

EXAM:
DG C-ARM 1-60 MIN; CERVICAL SPINE - 2-3 VIEW
CONTRAST:  None.
FLUOROSCOPY TIME:  Fluoroscopy Time:  0 minutes 36 seconds
Radiation Exposure Index (if provided by the fluoroscopic device):
16.8 mGy
Number of Acquired Spot Images: 0

[Series 1: run · 4 of 4 slices shown]
[im 1/4]
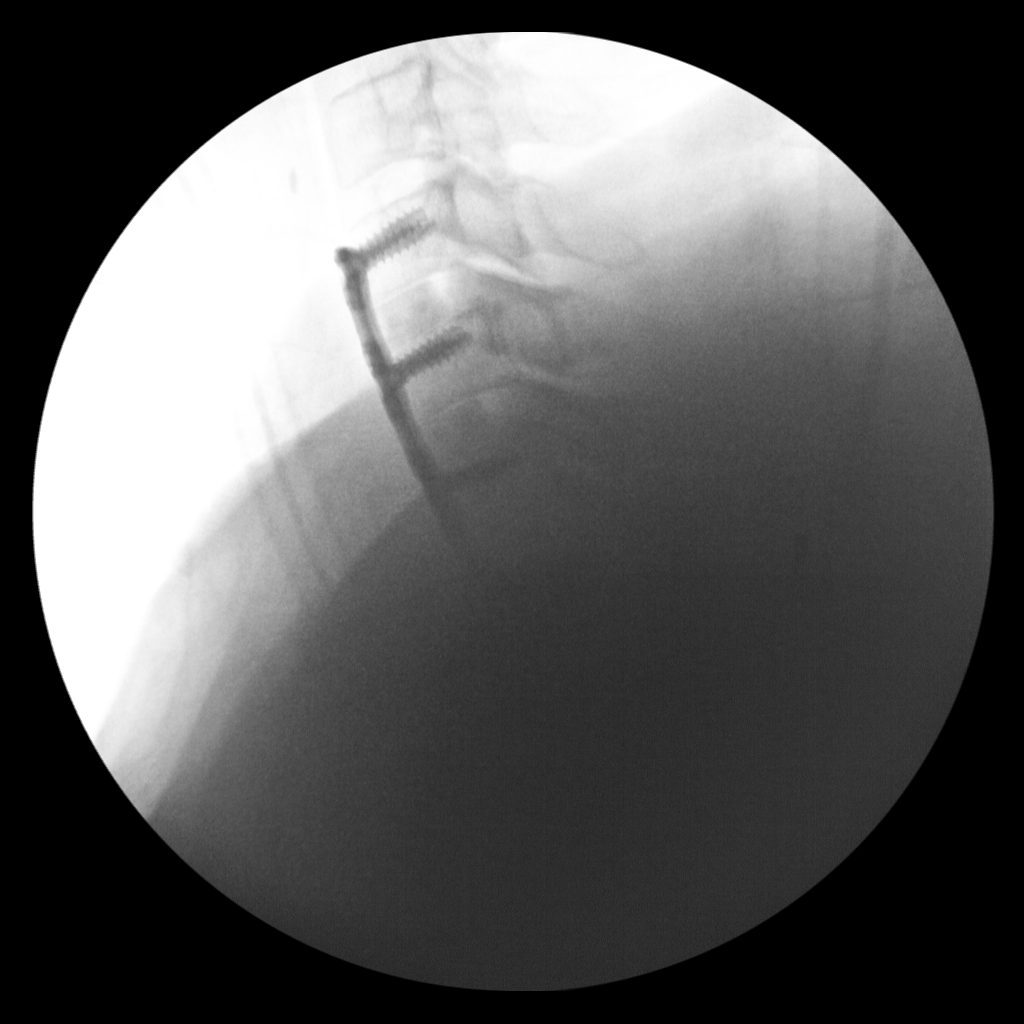
[im 2/4]
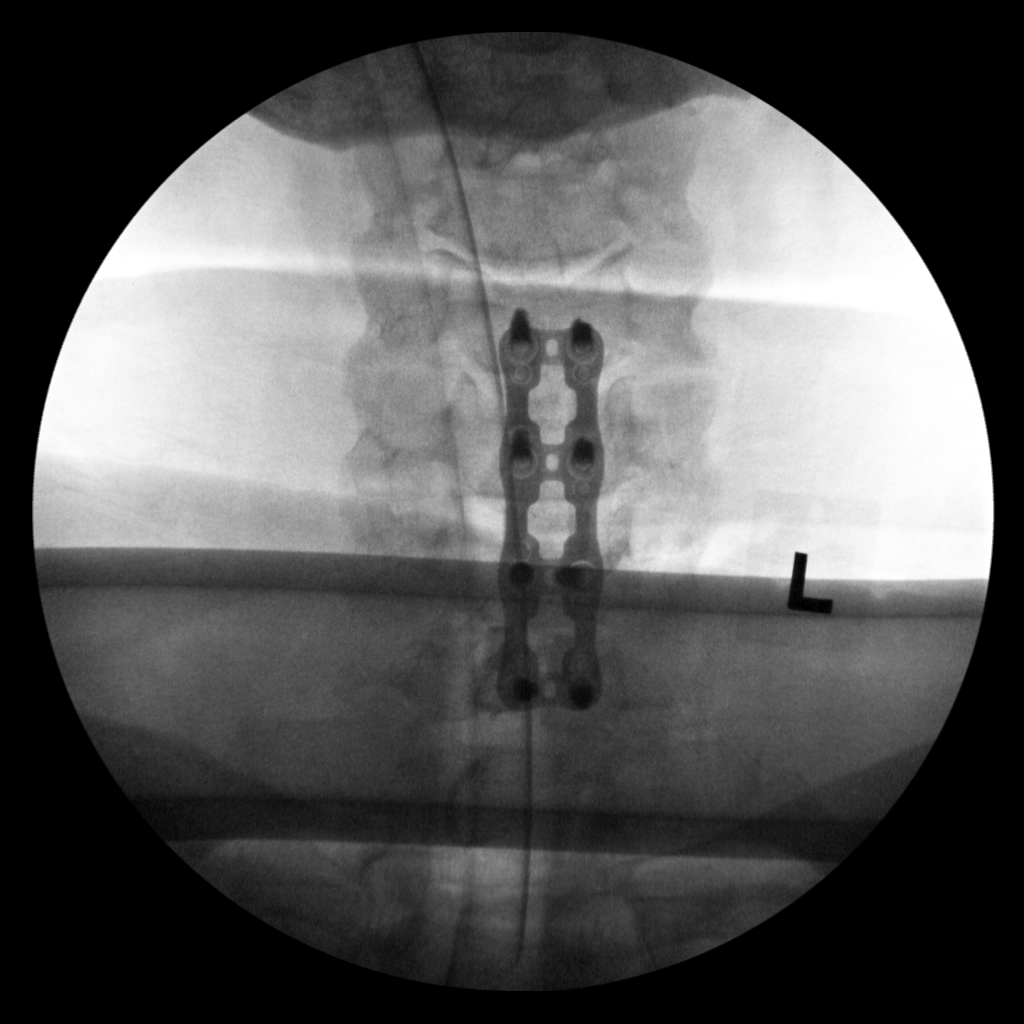
[im 3/4]
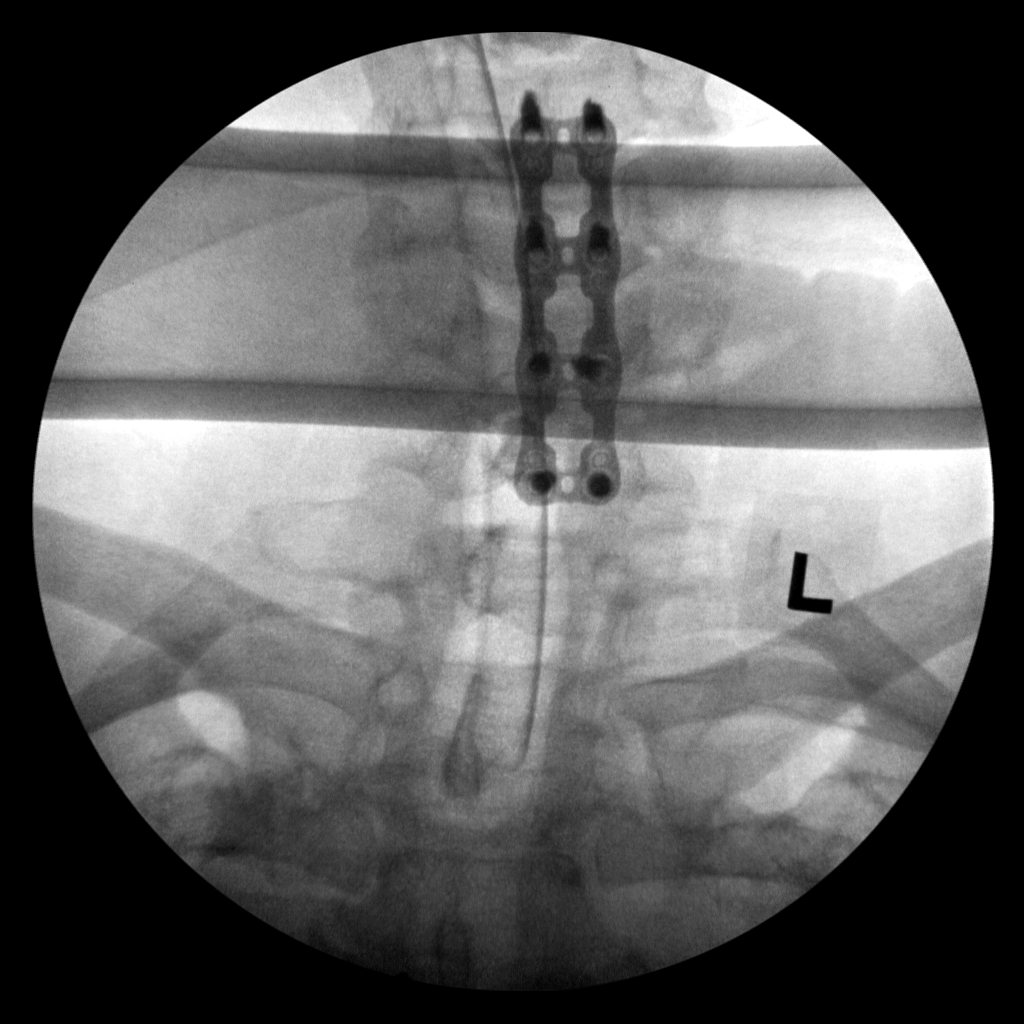
[im 4/4]
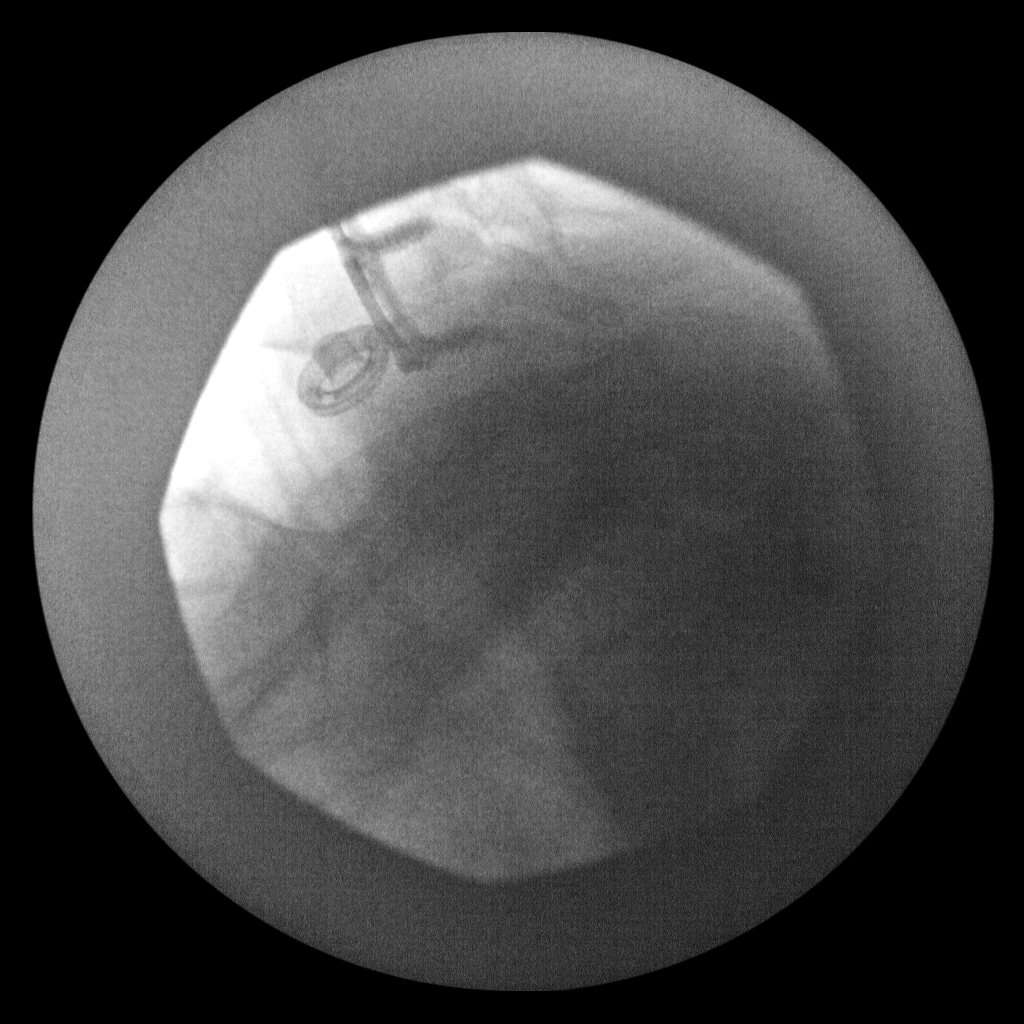

[4 of 4 positions shown; findings below may reference images not displayed]

FINDINGS: Four intraoperative fluoroscopic spot views of the cervical spine.
ACDF hardware appears to be at the C4 through C7 levels. No adverse
features are evident.
IMPRESSION: Intraoperative images of C4 through C7 ACDF.

## 2021-03-18 IMAGING — RF DG CERVICAL SPINE 2 OR 3 VIEWS
1 series · 4 of 4 positions shown · IV contrast (agent unspecified)
Comparison: Cervical spine MRI 11/28/2019.

CLINICAL DATA: 49-year-old male undergoing cervical spine surgery.

EXAM:
DG C-ARM 1-60 MIN; CERVICAL SPINE - 2-3 VIEW
CONTRAST:  None.
FLUOROSCOPY TIME:  Fluoroscopy Time:  0 minutes 36 seconds
Radiation Exposure Index (if provided by the fluoroscopic device):
16.8 mGy
Number of Acquired Spot Images: 0

[Series 1: run · 4 of 4 slices shown]
[im 1/4]
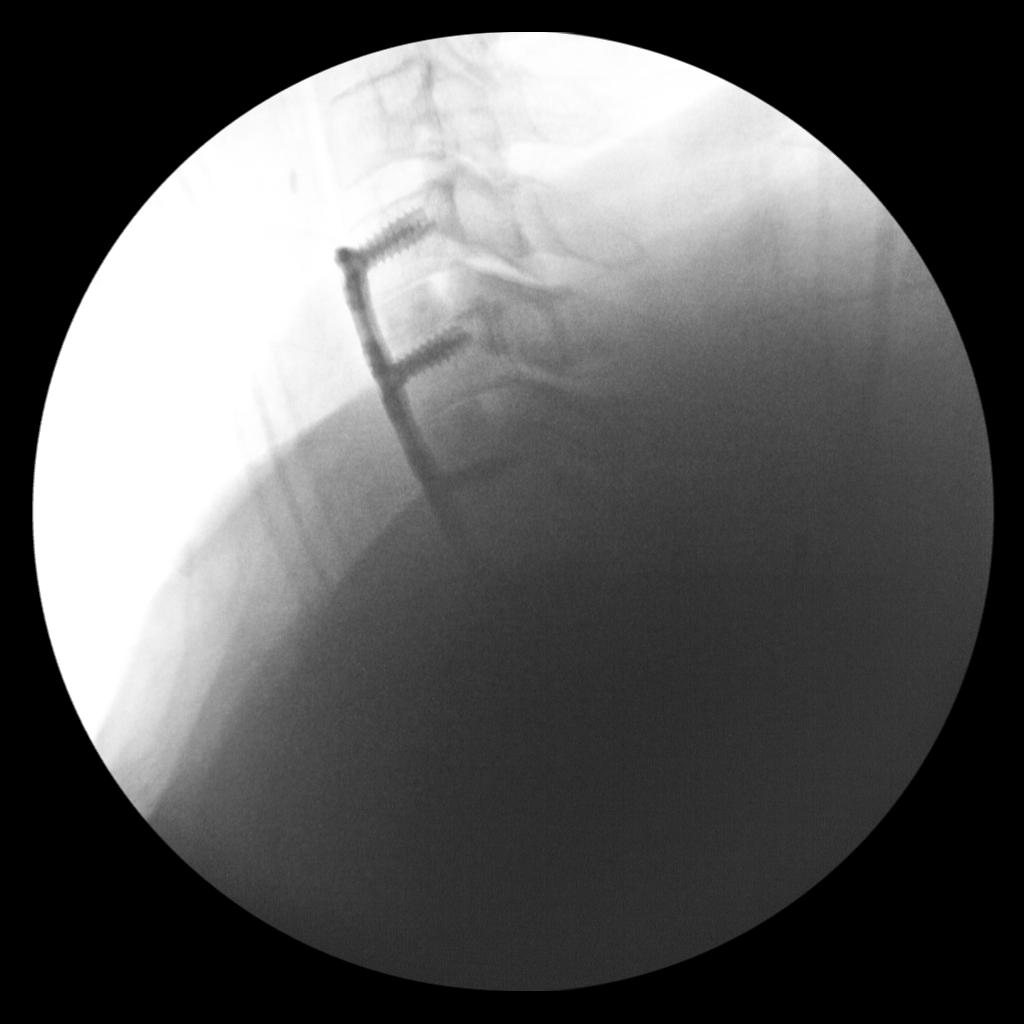
[im 2/4]
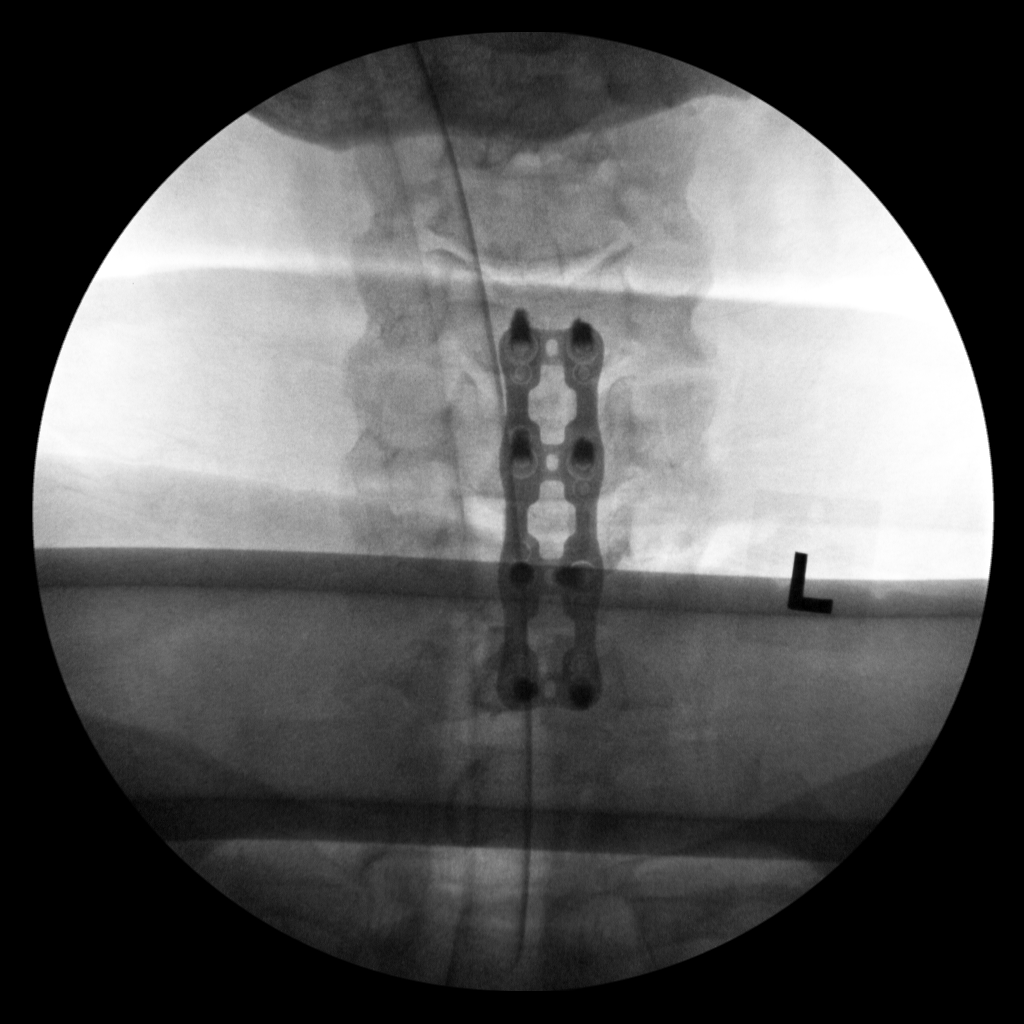
[im 3/4]
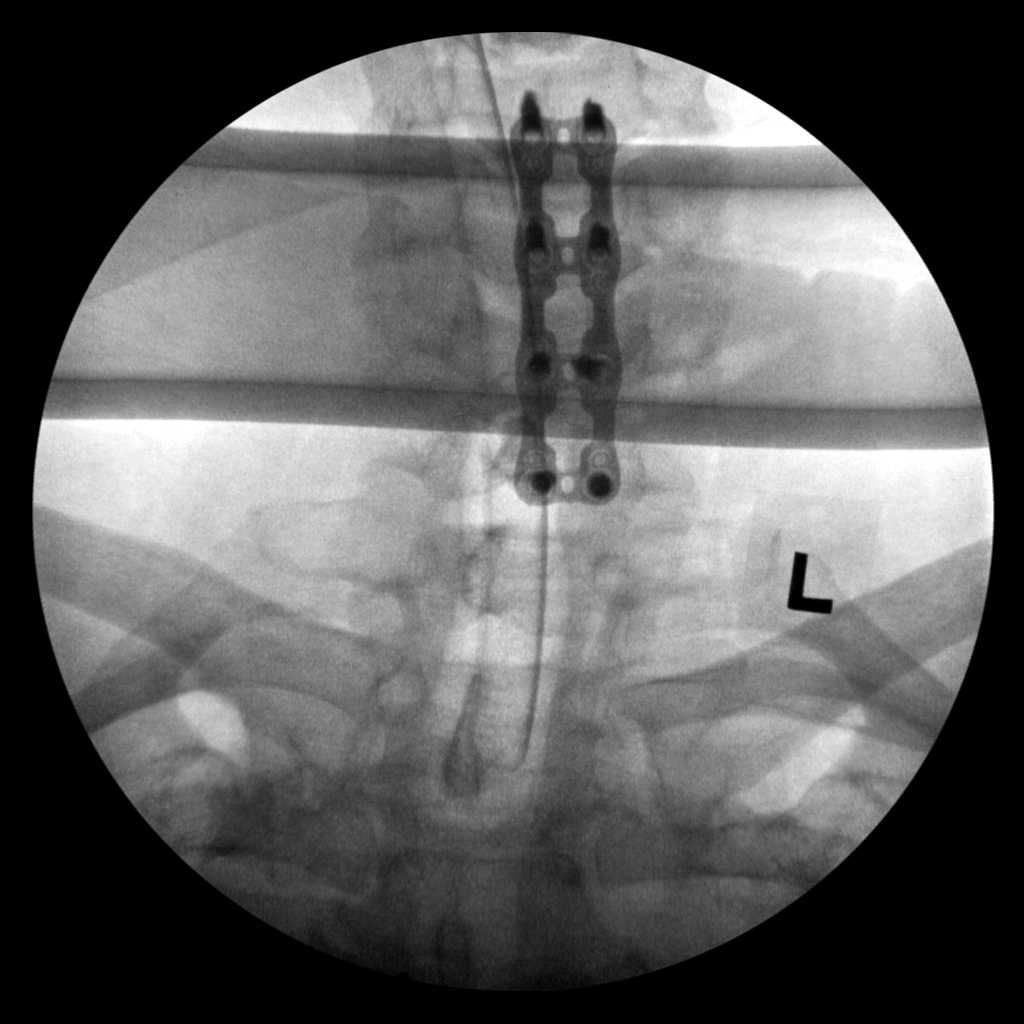
[im 4/4]
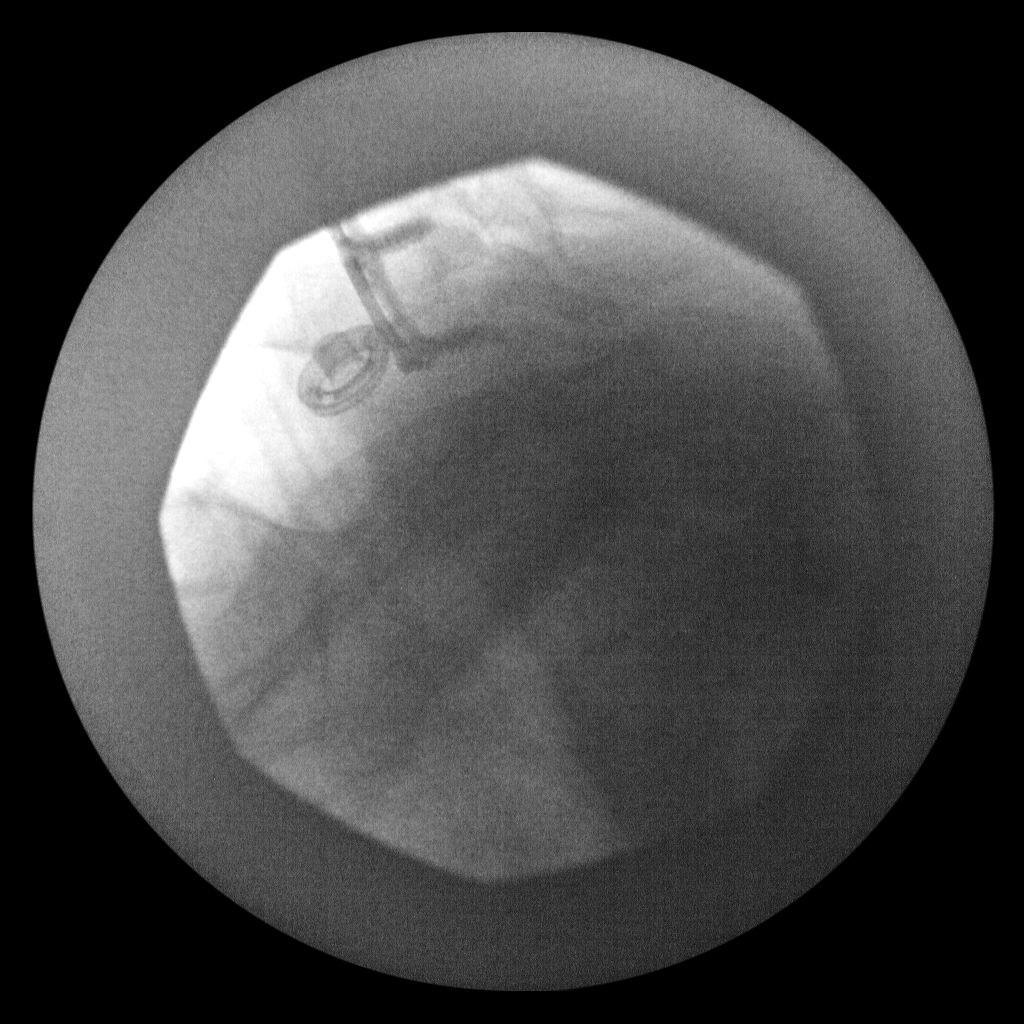

[4 of 4 positions shown; findings below may reference images not displayed]

FINDINGS: Four intraoperative fluoroscopic spot views of the cervical spine.
ACDF hardware appears to be at the C4 through C7 levels. No adverse
features are evident.
IMPRESSION: Intraoperative images of C4 through C7 ACDF.

## 2021-10-20 ENCOUNTER — Other Ambulatory Visit: Payer: Self-pay | Admitting: Orthopaedic Surgery

## 2021-10-20 DIAGNOSIS — M4802 Spinal stenosis, cervical region: Secondary | ICD-10-CM

## 2021-11-02 ENCOUNTER — Ambulatory Visit
Admission: RE | Admit: 2021-11-02 | Discharge: 2021-11-02 | Disposition: A | Discharge status: Home / Self Care | Payer: BC Managed Care – PPO | Source: Ambulatory Visit | Attending: Orthopaedic Surgery | Admitting: Orthopaedic Surgery

## 2021-11-02 DIAGNOSIS — M4802 Spinal stenosis, cervical region: Secondary | ICD-10-CM

## 2023-04-11 ENCOUNTER — Other Ambulatory Visit: Payer: Self-pay | Admitting: Physician Assistant

## 2023-04-11 DIAGNOSIS — N200 Calculus of kidney: Secondary | ICD-10-CM

## 2023-05-02 ENCOUNTER — Ambulatory Visit
Admission: RE | Admit: 2023-05-02 | Discharge: 2023-05-02 | Disposition: A | Discharge status: Home / Self Care | Payer: BC Managed Care – PPO | Source: Ambulatory Visit | Attending: Physician Assistant | Admitting: Physician Assistant

## 2023-05-02 DIAGNOSIS — N200 Calculus of kidney: Secondary | ICD-10-CM

## 2024-02-26 DEATH — deceased
